# Patient Record
Sex: Female | Born: 1980 | Race: White | Hispanic: No | Marital: Married | Smoking: Former smoker
Health system: Southern US, Community
[De-identification: ages and names within clinical notes are randomized; demographics above are authoritative.]

## PROBLEM LIST (undated history)

## (undated) DIAGNOSIS — T7840XA Allergy, unspecified, initial encounter: Secondary | ICD-10-CM

## (undated) DIAGNOSIS — R112 Nausea with vomiting, unspecified: Secondary | ICD-10-CM

## (undated) DIAGNOSIS — L509 Urticaria, unspecified: Secondary | ICD-10-CM

## (undated) DIAGNOSIS — M2550 Pain in unspecified joint: Secondary | ICD-10-CM

## (undated) DIAGNOSIS — F419 Anxiety disorder, unspecified: Principal | ICD-10-CM

## (undated) DIAGNOSIS — R768 Other specified abnormal immunological findings in serum: Secondary | ICD-10-CM

## (undated) DIAGNOSIS — K219 Gastro-esophageal reflux disease without esophagitis: Secondary | ICD-10-CM

## (undated) DIAGNOSIS — Z9889 Other specified postprocedural states: Secondary | ICD-10-CM

## (undated) DIAGNOSIS — T63331A Toxic effect of venom of brown recluse spider, accidental (unintentional), initial encounter: Secondary | ICD-10-CM

## (undated) HISTORY — DX: Toxic effect of venom of brown recluse spider, accidental (unintentional), initial encounter: T63.331A

## (undated) HISTORY — DX: Allergy, unspecified, initial encounter: T78.40XA

## (undated) HISTORY — PX: LEG SURGERY: SHX1003

## (undated) HISTORY — DX: Other specified abnormal immunological findings in serum: R76.8

## (undated) HISTORY — DX: Anxiety disorder, unspecified: F41.9

## (undated) HISTORY — DX: Pain in unspecified joint: M25.50

## (undated) HISTORY — DX: Urticaria, unspecified: L50.9

## (undated) HISTORY — DX: Gastro-esophageal reflux disease without esophagitis: K21.9

---

## 2011-11-01 LAB — HM HIV SCREENING LAB: HM HIV SCREENING: NEGATIVE

## 2015-07-15 ENCOUNTER — Encounter: Payer: Self-pay | Admitting: Family Medicine

## 2015-11-23 ENCOUNTER — Encounter: Payer: Self-pay | Admitting: Family Medicine

## 2015-12-06 ENCOUNTER — Encounter: Payer: Self-pay | Admitting: Family Medicine

## 2016-04-18 ENCOUNTER — Encounter: Payer: Self-pay | Admitting: Family Medicine

## 2017-03-13 ENCOUNTER — Ambulatory Visit: Payer: Self-pay | Admitting: Family

## 2017-03-13 ENCOUNTER — Encounter: Payer: Self-pay | Admitting: *Deleted

## 2017-03-13 VITALS — BP 112/78 | HR 66 | Temp 98.9°F | Wt 164.4 lb

## 2017-03-13 DIAGNOSIS — J40 Bronchitis, not specified as acute or chronic: Secondary | ICD-10-CM

## 2017-03-13 DIAGNOSIS — J329 Chronic sinusitis, unspecified: Secondary | ICD-10-CM

## 2017-03-13 MED ORDER — AMOXICILLIN-POT CLAVULANATE 875-125 MG PO TABS: 1.0000 | ORAL_TABLET | Freq: Two times a day (BID) | ORAL | 0 refills | Status: DC

## 2017-03-13 MED ORDER — FLUTICASONE PROPIONATE 50 MCG/ACT NA SUSP: 2.0000 | Freq: Every day | NASAL | 6 refills | Status: DC

## 2017-03-13 NOTE — Patient Instructions (Signed)

## 2017-03-13 NOTE — Progress Notes (Signed)
   Subjective:    Patient ID: Shannon Gordon, female    DOB: 1980/07/05, 37 y.o.   MRN: [id number]  Cough  This is a new problem. The current episode started 1 to 4 weeks ago. The problem has been unchanged. The problem occurs every few minutes. The cough is productive of sputum. Associated symptoms include chills, headaches, myalgias, nasal congestion, postnasal drip and rhinorrhea. Pertinent negatives include no ear congestion, ear pain, fever or wheezing. Associated symptoms comments: Sinus pressure . The treatment provided mild relief.      Review of Systems  Constitutional: Positive for chills. Negative for fever.  HENT: Positive for postnasal drip and rhinorrhea. Negative for ear pain.   Respiratory: Positive for cough. Negative for wheezing.   Musculoskeletal: Positive for myalgias.  Neurological: Positive for headaches.  All other systems reviewed and are negative.      Objective:   Physical Exam  Constitutional: She is oriented to person, place, and time. She appears well-developed and well-nourished. No distress.  HENT:  Head: Normocephalic and atraumatic.  Right Ear: External ear normal.  Nose: Mucosal edema and rhinorrhea present. Right sinus exhibits maxillary sinus tenderness. Left sinus exhibits maxillary sinus tenderness.  Mouth/Throat: Posterior oropharyngeal erythema present.  Eyes: Pupils are equal, round, and reactive to light.  Neck: Normal range of motion. Neck supple. No thyromegaly present.  Cardiovascular: Normal rate, regular rhythm, normal heart sounds and intact distal pulses.  No murmur heard. Pulmonary/Chest: Effort normal and breath sounds normal. No respiratory distress. She has no wheezes.  Abdominal: Soft. Bowel sounds are normal. She exhibits no distension. There is no tenderness.  Musculoskeletal: Normal range of motion. She exhibits no edema or tenderness.  Neurological: She is alert and oriented to person, place, and time. She has normal  reflexes. No cranial nerve deficit.  Skin: Skin is warm and dry.  Psychiatric: She has a normal mood and affect. Her behavior is normal. Judgment and thought content normal.  Vitals reviewed.     BP 112/78 (BP Location: Right Arm, Patient Position: Sitting, Cuff Size: Normal)   Pulse 66   Temp 98.9 F (37.2 C) (Oral)   Wt 164 lb 6.4 oz (74.6 kg)   SpO2 98%      Assessment & Plan:  1. Sinobronchitis - Take meds as prescribed - Use a cool mist humidifier  -Force fluids -For any cough or congestion  Use plain Mucinex- regular strength or max strength is fine -For fever or aces or pains- take tylenol or ibuprofen appropriate for age and weight. -Throat lozenges if help -New toothbrush in 3 days - amoxicillin-clavulanate (AUGMENTIN) 875-125 MG tablet; Take 1 tablet by mouth 2 (two) times daily.  Dispense: 14 tablet; Refill: 0 - fluticasone (FLONASE) 50 MCG/ACT nasal spray; Place 2 sprays into both nostrils daily.  Dispense: 16 g; Refill: [place], FNP

## 2017-03-29 ENCOUNTER — Ambulatory Visit: Payer: Self-pay | Admitting: Emergency Medicine

## 2017-03-29 VITALS — BP 118/78 | HR 111 | Temp 99.4°F | Resp 18

## 2017-03-29 DIAGNOSIS — R110 Nausea: Secondary | ICD-10-CM

## 2017-03-29 DIAGNOSIS — J101 Influenza due to other identified influenza virus with other respiratory manifestations: Secondary | ICD-10-CM

## 2017-03-29 DIAGNOSIS — R52 Pain, unspecified: Secondary | ICD-10-CM

## 2017-03-29 LAB — POCT INFLUENZA A/B
INFLUENZA A, POC: POSITIVE — AB
INFLUENZA B, POC: NEGATIVE

## 2017-03-29 MED ORDER — BALOXAVIR MARBOXIL(40 MG DOSE) 2 X 20 MG PO TBPK: 40.0000 mg | ORAL_TABLET | Freq: Once | ORAL | 0 refills | Status: AC

## 2017-03-29 MED ORDER — ONDANSETRON HCL 4 MG PO TABS: 4.0000 mg | ORAL_TABLET | Freq: Three times a day (TID) | ORAL | 0 refills | Status: DC | PRN

## 2017-03-29 NOTE — Patient Instructions (Signed)

## 2017-03-29 NOTE — Progress Notes (Signed)
S: Shannon Gordon is a 37 y.o. female who presents for flu like symptoms to include myalgias, fever, chills, fatigue, malaise. Has taken ibuprofen with minimal relief. Has had nausea as well, has not vomited. Has not eaten. Otherwise in good health.  Review of Systems  Constitutional: Positive for chills, fever and malaise/fatigue.  HENT: Positive for congestion and sore throat.   Respiratory: Positive for cough. Negative for shortness of breath and wheezing.   Cardiovascular: Negative.   Gastrointestinal: Positive for nausea. Negative for diarrhea and vomiting.  Musculoskeletal: Positive for myalgias.  Neurological: Positive for headaches.   O: Results for orders placed or performed in visit on 03/29/17  POCT Influenza A/B  Result Value Ref Range   Influenza A, POC Positive (A) Negative   Influenza B, POC Negative Negative   Vitals:   03/29/17 1031  BP: 118/78  Pulse: (!) 111  Resp: 18  Temp: 99.4 F (37.4 C)  SpO2: 98%   Physical Exam  Constitutional: She appears well-developed and well-nourished. She appears ill. No distress.  HENT:  Head: Normocephalic.  Right Ear: Tympanic membrane and external ear normal.  Left Ear: Tympanic membrane and external ear normal.  Nose: Nose normal.  Mouth/Throat: Uvula is midline and oropharynx is clear and moist.  Eyes: Conjunctivae are normal.  Neck: Neck supple.  Cardiovascular: Regular rhythm. Tachycardia present.  Pulmonary/Chest: Effort normal and breath sounds normal.  Abdominal: Soft. There is no tenderness.  Lymphadenopathy:    She has cervical adenopathy.  Neurological: She is alert.  Skin: Skin is warm. Capillary refill takes less than 2 seconds. She is not diaphoretic.  Nursing note and vitals reviewed.   A: 1. Aches   2. Influenza A   3. Nausea     P:  1. Aches -Tylenol or motrin PRN - POCT Influenza A/B  2. Influenza A -Rest fluids, Mucinex DM, follow up as needed - Baloxavir Marboxil 40 MG Dose (XOFLUZA)  20 (2) MG TBPK; Take 40 mg by mouth once for 1 dose.  Dispense: 2 each; Refill: 0 - ondansetron (ZOFRAN) 4 MG tablet; Take 1 tablet (4 mg total) by mouth every 8 (eight) hours as needed for nausea or vomiting.  Dispense: 30 tablet; Refill: 0  3. Nausea  - Baloxavir Marboxil 40 MG Dose (XOFLUZA) 20 (2) MG TBPK; Take 40 mg by mouth once for 1 dose.  Dispense: 2 each; Refill: 0 - ondansetron (ZOFRAN) 4 MG tablet; Take 1 tablet (4 mg total) by mouth every 8 (eight) hours as needed for nausea or vomiting.  Dispense: 30 tablet; Refill: 0

## 2017-04-23 ENCOUNTER — Other Ambulatory Visit (HOSPITAL_COMMUNITY)
Admission: RE | Admit: 2017-04-23 | Discharge: 2017-04-23 | Disposition: A | Discharge status: Home / Self Care | Payer: 59 | Source: Ambulatory Visit | Attending: Obstetrics and Gynecology | Admitting: Obstetrics and Gynecology

## 2017-04-23 ENCOUNTER — Other Ambulatory Visit: Payer: Self-pay | Admitting: Obstetrics and Gynecology

## 2017-04-23 DIAGNOSIS — Z124 Encounter for screening for malignant neoplasm of cervix: Secondary | ICD-10-CM | POA: Diagnosis not present

## 2017-04-23 DIAGNOSIS — Z01419 Encounter for gynecological examination (general) (routine) without abnormal findings: Secondary | ICD-10-CM | POA: Diagnosis not present

## 2017-04-24 LAB — CYTOLOGY - PAP
ADEQUACY: ABSENT
Diagnosis: NEGATIVE
HPV: NOT DETECTED

## 2017-04-26 ENCOUNTER — Ambulatory Visit: Payer: Self-pay | Admitting: Family Medicine

## 2017-05-02 ENCOUNTER — Encounter: Payer: Self-pay | Admitting: Family Medicine

## 2017-05-02 ENCOUNTER — Ambulatory Visit (INDEPENDENT_AMBULATORY_CARE_PROVIDER_SITE_OTHER): Payer: 59 | Admitting: Family Medicine

## 2017-05-02 VITALS — BP 110/74 | HR 84 | Temp 98.0°F | Ht 64.0 in | Wt 156.8 lb

## 2017-05-02 DIAGNOSIS — M2550 Pain in unspecified joint: Secondary | ICD-10-CM | POA: Diagnosis not present

## 2017-05-02 DIAGNOSIS — F419 Anxiety disorder, unspecified: Secondary | ICD-10-CM | POA: Insufficient documentation

## 2017-05-02 DIAGNOSIS — Z13220 Encounter for screening for lipoid disorders: Secondary | ICD-10-CM

## 2017-05-02 DIAGNOSIS — R768 Other specified abnormal immunological findings in serum: Secondary | ICD-10-CM

## 2017-05-02 DIAGNOSIS — R635 Abnormal weight gain: Secondary | ICD-10-CM | POA: Diagnosis not present

## 2017-05-02 HISTORY — DX: Other specified abnormal immunological findings in serum: R76.8

## 2017-05-02 HISTORY — DX: Anxiety disorder, unspecified: F41.9

## 2017-05-02 HISTORY — DX: Pain in unspecified joint: M25.50

## 2017-05-02 LAB — COMPREHENSIVE METABOLIC PANEL
ALT: 29 U/L (ref 0–35)
AST: 34 U/L (ref 0–37)
Albumin: 4.3 g/dL (ref 3.5–5.2)
Alkaline Phosphatase: 55 U/L (ref 39–117)
BUN: 16 mg/dL (ref 6–23)
CO2: 30 mEq/L (ref 19–32)
Calcium: 10.2 mg/dL (ref 8.4–10.5)
Chloride: 103 mEq/L (ref 96–112)
Creatinine, Ser: 0.63 mg/dL (ref 0.40–1.20)
GFR: 113.1 mL/min (ref >60.00)
Glucose, Bld: 87 mg/dL (ref 70–99)
Potassium: 4.7 mEq/L (ref 3.5–5.1)
Sodium: 139 mEq/L (ref 135–145)
Total Bilirubin: 0.5 mg/dL (ref 0.2–1.2)
Total Protein: 7.3 g/dL (ref 6.0–8.3)

## 2017-05-02 LAB — LIPID PANEL
Cholesterol: 216 mg/dL — ABNORMAL HIGH (ref 0–200)
HDL: 74.7 mg/dL (ref >39.00)
LDL Cholesterol: 133 mg/dL — ABNORMAL HIGH (ref 0–99)
NonHDL: 141.65
Total CHOL/HDL Ratio: 3
Triglycerides: 43 mg/dL (ref 0.0–149.0)
VLDL: 8.6 mg/dL (ref 0.0–40.0)

## 2017-05-02 LAB — CBC WITH DIFFERENTIAL/PLATELET
Basophils Absolute: 0.1 10*3/uL (ref 0.0–0.1)
Basophils Relative: 1.1 % (ref 0.0–3.0)
Eosinophils Absolute: 0.1 10*3/uL (ref 0.0–0.7)
Eosinophils Relative: 2 % (ref 0.0–5.0)
HCT: 41.1 % (ref 36.0–46.0)
Hemoglobin: 14.1 g/dL (ref 12.0–15.0)
Lymphocytes Relative: 38.8 % (ref 12.0–46.0)
Lymphs Abs: 2 10*3/uL (ref 0.7–4.0)
MCHC: 34.2 g/dL (ref 30.0–36.0)
MCV: 91 fl (ref 78.0–100.0)
Monocytes Absolute: 0.3 10*3/uL (ref 0.1–1.0)
Monocytes Relative: 6.5 % (ref 3.0–12.0)
Neutro Abs: 2.7 10*3/uL (ref 1.4–7.7)
Neutrophils Relative %: 51.6 % (ref 43.0–77.0)
Platelets: 255 10*3/uL (ref 150.0–400.0)
RBC: 4.52 Mil/uL (ref 3.87–5.11)
RDW: 13.4 % (ref 11.5–15.5)
WBC: 5.2 10*3/uL (ref 4.0–10.5)

## 2017-05-02 LAB — TSH: TSH: 1.54 u[IU]/mL (ref 0.35–4.50)

## 2017-05-02 MED ORDER — HYDROXYZINE HCL 25 MG PO TABS: 25.0000 mg | ORAL_TABLET | Freq: Every evening | ORAL | 0 refills | Status: DC | PRN

## 2017-05-02 NOTE — Progress Notes (Addendum)
Shannon Gordon is a 37 y.o. female is here for follow up.  History of Present Illness:   HPI: Patient presents for new patient appointment.  She moved from [place].  I actually met her earlier this week with her 52-year-old daughter.  Patient has a history of positive ANA, though she reports that she and her rheumatologist believe that she does not have an autoimmune disease.  She reports hives and anxiety intermittently.  She reports diffuse arthralgias and fatigue at times.  She has had some weight gain but admits that that is due to food indiscretions.  Review of Systems  Constitutional: Negative for chills, diaphoresis, fever, malaise/fatigue and weight loss.  HENT: Negative for hearing loss.   Eyes: Negative for blurred vision.  Respiratory: Negative for cough, shortness of breath and wheezing.   Cardiovascular: Negative for chest pain, palpitations and leg swelling.  Gastrointestinal: Negative for abdominal pain, constipation, diarrhea, heartburn, nausea and vomiting.  Genitourinary: Negative for dysuria, flank pain, frequency, hematuria and urgency.  Musculoskeletal: Positive for joint pain. Negative for myalgias.  Skin: Negative for rash.  Neurological: Negative for dizziness, weakness and headaches.  Psychiatric/Behavioral: Negative for depression, substance abuse and suicidal ideas. The patient is nervous/anxious. The patient does not have insomnia.    Health Maintenance Due  Topic Date Due  . HIV Screening  06/24/1995  . TETANUS/TDAP  06/24/1999   Depression screen PHQ 2/9 05/02/2017  Decreased Interest 0  Down, Depressed, Hopeless 0  PHQ - 2 Score 0     PMHx, SurgHx, SocialHx, FamHx, Medications, and Allergies were reviewed in the Visit Navigator and updated as appropriate.  There are no active problems to display for this patient.  Social History   Tobacco Use  . Smoking status: Former Smoker    Types: Cigarettes    Last attempt to quit: 2013    Years since  quitting: 6.2  Substance Use Topics  . Alcohol use: Not on file  . Drug use: Not on file   Current Medications and Allergies:   .  ondansetron (ZOFRAN) 4 MG tablet, Take 1 tablet (4 mg total) by mouth every 8 (eight) hours as needed for nausea or vomiting., Disp: 30 tablet, Rfl: 0  No Known Allergies   Review of Systems   Pertinent items are noted in the HPI. Otherwise, ROS is negative.  Vitals:   Vitals:   05/02/17 1022  BP: 110/74  Pulse: 84  Temp: 98 F (36.7 C)  TempSrc: Oral  SpO2: 97%  Weight: 156 lb 12.8 oz (71.1 kg)  Height: 5' 4"  (1.626 m)     Body mass index is 26.91 kg/m.   Physical Exam:   Physical Exam  Constitutional: She is oriented to person, place, and time. She appears well-developed and well-nourished. No distress.  HENT:  Head: Normocephalic and atraumatic.  Right Ear: External ear normal.  Left Ear: External ear normal.  Nose: Nose normal.  Mouth/Throat: Oropharynx is clear and moist.  Eyes: Pupils are equal, round, and reactive to light. Conjunctivae and EOM are normal.  Neck: Normal range of motion. Neck supple. No thyromegaly present.  Cardiovascular: Normal rate, regular rhythm, normal heart sounds and intact distal pulses.  Pulmonary/Chest: Effort normal and breath sounds normal.  Abdominal: Soft. Bowel sounds are normal.  Musculoskeletal: Normal range of motion.  Lymphadenopathy:    She has no cervical adenopathy.  Neurological: She is alert and oriented to person, place, and time.  Skin: Skin is warm and dry. Capillary refill takes  less than 2 seconds.  Psychiatric: She has a normal mood and affect. Her behavior is normal.  Nursing note and vitals reviewed.   Results for orders placed or performed in visit on 05/02/17  HM HIV SCREENING LAB  Result Value Ref Range   HM HIV Screening Negative - Validated    Assessment and Plan:   Shontay was seen today for establish care.  Diagnoses and all orders for this  visit:  Anxiety Comments: We reviewed hydroxyzine as an option for her anxiety, especially since it is manifested as itching and hives as well. Orders: -     hydrOXYzine (ATARAX/VISTARIL) 25 MG tablet; Take 1 tablet (25 mg total) by mouth at bedtime as needed for anxiety (insomnia).  Screening for lipid disorders -     Lipid panel  Weight gain Comments: Likely due to increased food intake.  Labs pending. Orders: -     CBC with Differential/Platelet -     Comprehensive metabolic panel -     TSH  Positive ANA (antinuclear antibody) Comments: Patient already follows with rheumatology.  Arthralgia, unspecified joint Comments: Patient has been followed by orthopedics in the past.  She does take Mobic and as needed tramadol.  I am okay with taking his medications over.   . Reviewed expectations re: course of current medical issues. . Discussed self-management of symptoms. . Outlined signs and symptoms indicating need for more acute intervention. . Patient verbalized understanding and all questions were answered. Marland Kitchen Health Maintenance issues including appropriate healthy diet, exercise, and smoking avoidance were discussed with patient. . See orders for this visit as documented in the electronic medical record. . Patient received an After Visit Summary.  Briscoe Deutscher, DO [place] 05/02/2017  Future Appointments  Date Time Provider [place]  10/02/2017  9:00 AM Briscoe Deutscher, DO [facility]

## 2017-07-04 ENCOUNTER — Ambulatory Visit: Payer: Self-pay | Admitting: *Deleted

## 2017-07-04 ENCOUNTER — Encounter: Payer: Self-pay | Admitting: Family Medicine

## 2017-07-04 ENCOUNTER — Ambulatory Visit (INDEPENDENT_AMBULATORY_CARE_PROVIDER_SITE_OTHER): Payer: 59 | Admitting: Family Medicine

## 2017-07-04 VITALS — BP 120/78 | HR 85 | Temp 97.8°F | Ht 64.0 in | Wt 161.0 lb

## 2017-07-04 DIAGNOSIS — S51812A Laceration without foreign body of left forearm, initial encounter: Secondary | ICD-10-CM

## 2017-07-04 DIAGNOSIS — Z23 Encounter for immunization: Secondary | ICD-10-CM | POA: Diagnosis not present

## 2017-07-04 MED ORDER — MUPIROCIN 2 % EX OINT: 1.0000 "application " | TOPICAL_OINTMENT | Freq: Three times a day (TID) | CUTANEOUS | 0 refills | Status: DC

## 2017-07-04 NOTE — Telephone Encounter (Signed)
Pt has a wound approx 2  Inches in length some redness and  Slight puffiness noted.  Same day appointment made with Dr Rogers Blocker

## 2017-07-04 NOTE — Telephone Encounter (Signed)
  Reason for Disposition . [1] Looks infected (spreading redness, pus) AND [2] no fever  Answer Assessment - Initial Assessment Questions 1. APPEARANCE of INJURY: "What does the injury look like?"       Red  Slightly puffy some drainage brownish in color   2. SIZE: "How large is the cut?"       approx 2  Inch  3. BLEEDING: "Is it bleeding now?" If so, ask: "Is it difficult to stop?"        No 4. LOCATION: "Where is the injury located?"         L forearm   5. ONSET: "How long ago did the injury occur?"        3  Days ago  6. MECHANISM: "Tell me how it happened."         sustained a laceration from the edge of a glass table   7. TETANUS: "When was the last tetanus booster?"      2013 or 2014  Not positive which year   8. PREGNANCY: "Is there any chance you are pregnant?" "When was your last menstrual period?"        approx 1  Month ago has  IUD  Protocols used: [place]

## 2017-07-04 NOTE — Progress Notes (Signed)
Patient: Shannon Gordon MRN: [id number] DOB: November 19, 1980 PCP: Briscoe Deutscher, DO     Subjective:  Chief Complaint  Patient presents with  . Wound Check    HPI: The patient is a 37 y.o. female who presents today for deep cut in her left forearm. She was putting a glass top on the table and it cut her. This was on Sunday. She states it bled a lot and is looking better today. The redness is not as bad as it was. No fever/chills. She has used neosporin, h202. Last tetanus was over 5 years ago. She has full range of motion in her arm/hand. Strength and sensation intact. No glass broke off.    Review of Systems  Constitutional: Negative for chills, fatigue and fever.  Respiratory: Negative for chest tightness and shortness of breath.   Cardiovascular: Negative for chest pain, palpitations and leg swelling.  Skin: Positive for wound.  Allergic/Immunologic: Negative for immunocompromised state.  Neurological: Negative for weakness and numbness.  Hematological: Does not bruise/bleed easily.    Allergies Patient has No Known Allergies.  Past Medical History Patient  has a past medical history of Anxiety (05/02/2017), Arthralgia (05/02/2017), and Positive ANA (antinuclear antibody) (05/02/2017).  Surgical History Patient  has no past surgical history on file.  Family History Pateint's family history includes Hypothyroidism in her maternal grandmother; Thyroid disease in her mother.  Social History Patient  reports that she quit smoking about 6 years ago. Her smoking use included cigarettes. She has a 5.00 pack-year smoking history. She has never used smokeless tobacco. She reports that she drinks alcohol. She reports that she does not use drugs.    Objective: Vitals:   07/04/17 1201  BP: 120/78  Pulse: 85  Temp: 97.8 F (36.6 C)  SpO2: 96%  Weight: 161 lb (73 kg)  Height: 5\' 4"  (1.626 m)    Body mass index is 27.64 kg/m.  Physical Exam  Constitutional: She appears  well-developed and well-nourished.  Neurological: No sensory deficit. She exhibits normal muscle tone.  Skin:  4cm laceration. Healing well. No erythema, edema or bleeding.   Vitals reviewed.      Assessment/plan: 1. Laceration of left forearm, initial encounter Tetanus since >5 years. Wound healing nicely. Reassured her. bactroban Bid-TID and discussed keeping covered. If erythema, edema, fever, increasing pain return/ER.  - Td : Tetanus/diphtheria >7yo Preservative  free    Return if symptoms worsen or fail to improve.   Orma Flaming, MD [place]   07/04/2017

## 2017-07-31 DIAGNOSIS — L239 Allergic contact dermatitis, unspecified cause: Secondary | ICD-10-CM | POA: Diagnosis not present

## 2017-08-01 DIAGNOSIS — R21 Rash and other nonspecific skin eruption: Secondary | ICD-10-CM | POA: Diagnosis not present

## 2017-08-02 NOTE — Progress Notes (Signed)
Shannon Gordon is a 37 y.o. female is here for follow up.  History of Present Illness:  Shannon Gordon, [name] acting as scribe for Dr. Briscoe Deutscher.   HPI: Patient states that she started breaking out in rash on both hands on Monday. By Tuesday it had spread to face and scalp. So she was seen by dermatology that day and was given steroid  injection in office. She was not better on Wednesday so she went to urgent care and was given prednisone prescription. It has help a little so far. She would like referral to Allergy for testing.   There are no preventive care reminders to display for this patient. Depression screen [facility] 2/9 08/03/2017 05/02/2017  Decreased Interest 0 0  Down, Depressed, Hopeless 0 0  PHQ - 2 Score 0 0  Altered sleeping 0 -  Tired, decreased energy 0 -  Change in appetite 0 -  Feeling bad or failure about yourself  0 -  Trouble concentrating 0 -  Moving slowly or fidgety/restless 0 -  Suicidal thoughts 0 -  PHQ-9 Score 0 -   PMHx, SurgHx, SocialHx, FamHx, Medications, and Allergies were reviewed in the Visit Navigator and updated as appropriate.   Patient Active Problem List   Diagnosis Date Noted  . Anxiety 05/02/2017  . Weight gain 05/02/2017  . Positive ANA (antinuclear antibody) 05/02/2017  . Arthralgia 05/02/2017   Social History   Tobacco Use  . Smoking status: Former Smoker    Packs/day: 0.50    Years: 10.00    Pack years: 5.00    Types: Cigarettes    Last attempt to quit: 2013    Years since quitting: 6.5  . Smokeless tobacco: Never Used  Substance Use Topics  . Alcohol use: Yes    Comment: occ use only   . Drug use: Never   Current Medications and Allergies:   Current Outpatient Medications:  .  cetirizine (ZYRTEC) 10 MG tablet, , Disp: , Rfl:  .  omeprazole (PRILOSEC) 10 MG capsule, Take 10 mg by mouth daily., Disp: , Rfl:  .  ondansetron (ZOFRAN) 4 MG tablet, Take 1 tablet (4 mg total) by mouth every 8 (eight) hours as needed for nausea  or vomiting., Disp: 30 tablet, Rfl: 0 .  predniSONE (DELTASONE) 50 MG tablet, , Disp: , Rfl:  .  triamcinolone cream (KENALOG) 0.1 %, , Disp: , Rfl:  .  hydrOXYzine (ATARAX/VISTARIL) 25 MG tablet, Take 1 tablet (25 mg total) by mouth every 8 (eight) hours as needed for anxiety or itching (insomnia)., Disp: 60 tablet, Rfl: 5  No Known Allergies Review of Systems   Pertinent items are noted in the HPI. Otherwise, ROS is negative.  Vitals:   Vitals:   08/03/17 1001  BP: 120/78  Pulse: 89  Temp: 98.3 F (36.8 C)  TempSrc: Oral  SpO2: 99%  Weight: 163 lb (73.9 kg)  Height: 5\' 4"  (1.626 m)     Body mass index is 27.98 kg/m.  Physical Exam:   Physical Exam  Constitutional: She appears well-nourished.  HENT:  Head: Normocephalic and atraumatic.  Eyes: Pupils are equal, round, and reactive to light. EOM are normal.  Neck: Normal range of motion. Neck supple.  Cardiovascular: Normal rate, regular rhythm, normal heart sounds and intact distal pulses.  Pulmonary/Chest: Effort normal.  Abdominal: Soft.  Skin: Skin is warm.  Psychiatric: She has a normal mood and affect. Her behavior is normal.  Nursing note and vitals reviewed.  Results for  orders placed or performed in visit on 05/02/17  HM HIV SCREENING LAB  Result Value Ref Range   HM HIV Screening Negative - Validated   CBC with Differential/Platelet  Result Value Ref Range   WBC 5.2 4.0 - 10.5 K/uL   RBC 4.52 3.87 - 5.11 Mil/uL   Hemoglobin 14.1 12.0 - 15.0 g/dL   HCT 41.1 36.0 - 46.0 %   MCV 91.0 78.0 - 100.0 fl   MCHC 34.2 30.0 - 36.0 g/dL   RDW 13.4 11.5 - 15.5 %   Platelets 255.0 150.0 - 400.0 K/uL   Neutrophils Relative % 51.6 43.0 - 77.0 %   Lymphocytes Relative 38.8 12.0 - 46.0 %   Monocytes Relative 6.5 3.0 - 12.0 %   Eosinophils Relative 2.0 0.0 - 5.0 %   Basophils Relative 1.1 0.0 - 3.0 %   Neutro Abs 2.7 1.4 - 7.7 K/uL   Lymphs Abs 2.0 0.7 - 4.0 K/uL   Monocytes Absolute 0.3 0.1 - 1.0 K/uL    Eosinophils Absolute 0.1 0.0 - 0.7 K/uL   Basophils Absolute 0.1 0.0 - 0.1 K/uL  Comprehensive metabolic panel  Result Value Ref Range   Sodium 139 135 - 145 mEq/L   Potassium 4.7 3.5 - 5.1 mEq/L   Chloride 103 96 - 112 mEq/L   CO2 30 19 - 32 mEq/L   Glucose, Bld 87 70 - 99 mg/dL   BUN 16 6 - 23 mg/dL   Creatinine, Ser 0.63 0.40 - 1.20 mg/dL   Total Bilirubin 0.5 0.2 - 1.2 mg/dL   Alkaline Phosphatase 55 39 - 117 U/L   AST 34 0 - 37 U/L   ALT 29 0 - 35 U/L   Total Protein 7.3 6.0 - 8.3 g/dL   Albumin 4.3 3.5 - 5.2 g/dL   Calcium 10.2 8.4 - 10.5 mg/dL   GFR 113.10 >60.00 mL/min  TSH  Result Value Ref Range   TSH 1.54 0.35 - 4.50 uIU/mL  Lipid panel  Result Value Ref Range   Cholesterol 216 (H) 0 - 200 mg/dL   Triglycerides 43.0 0.0 - 149.0 mg/dL   HDL 74.70 >39.00 mg/dL   VLDL 8.6 0.0 - 40.0 mg/dL   LDL Cholesterol 133 (H) 0 - 99 mg/dL   Total CHOL/HDL Ratio 3    NonHDL 141.65     Assessment and Plan:   Shannon Gordon was seen today for rash.  Diagnoses and all orders for this visit:  Rash and nonspecific skin eruption -     Ambulatory referral to Allergy -     hydrOXYzine (ATARAX/VISTARIL) 25 MG tablet; Take 1 tablet (25 mg total) by mouth every 8 (eight) hours as needed for anxiety or itching (insomnia).  Positive ANA (antinuclear antibody)    . Reviewed expectations re: course of current medical issues. . Discussed self-management of symptoms. . Outlined signs and symptoms indicating need for more acute intervention. . Patient verbalized understanding and all questions were answered. Marland Kitchen Health Maintenance issues including appropriate healthy diet, exercise, and smoking avoidance were discussed with patient. . See orders for this visit as documented in the electronic medical record. . Patient received an After Visit Summary.  Briscoe Deutscher, DO [place] 08/03/2017  Future Appointments  Date Time Provider [place]  10/02/2017  9:00 AM Briscoe Deutscher, DO [facility]   [name] served as scribe during this visit. History, Physical, and Plan performed by medical provider. The above documentation has been reviewed and is accurate and  complete. Briscoe Deutscher, D.O.

## 2017-08-03 ENCOUNTER — Encounter: Payer: Self-pay | Admitting: Family Medicine

## 2017-08-03 ENCOUNTER — Ambulatory Visit (INDEPENDENT_AMBULATORY_CARE_PROVIDER_SITE_OTHER): Payer: 59 | Admitting: Family Medicine

## 2017-08-03 VITALS — BP 120/78 | HR 89 | Temp 98.3°F | Ht 64.0 in | Wt 163.0 lb

## 2017-08-03 DIAGNOSIS — R768 Other specified abnormal immunological findings in serum: Secondary | ICD-10-CM

## 2017-08-03 DIAGNOSIS — R21 Rash and other nonspecific skin eruption: Secondary | ICD-10-CM | POA: Diagnosis not present

## 2017-08-03 MED ORDER — HYDROXYZINE HCL 25 MG PO TABS: 25.0000 mg | ORAL_TABLET | Freq: Three times a day (TID) | ORAL | 5 refills | Status: DC | PRN

## 2017-08-31 ENCOUNTER — Encounter: Payer: Self-pay | Admitting: Family Medicine

## 2017-09-01 ENCOUNTER — Other Ambulatory Visit: Payer: Self-pay | Admitting: Family Medicine

## 2017-09-01 ENCOUNTER — Telehealth: Payer: 59 | Admitting: Nurse Practitioner

## 2017-09-01 DIAGNOSIS — L242 Irritant contact dermatitis due to solvents: Secondary | ICD-10-CM

## 2017-09-01 DIAGNOSIS — F419 Anxiety disorder, unspecified: Secondary | ICD-10-CM

## 2017-09-01 MED ORDER — PREDNISONE 10 MG (21) PO TBPK: ORAL_TABLET | ORAL | 0 refills | Status: DC

## 2017-09-01 NOTE — Progress Notes (Signed)
E Visit for Rash  We are sorry that you are not feeling well. Here is how we plan to help!       Prednisone 10 mg daily for 6 days (see taper instructions below)  Directions for 6 day taper: Day 1: 2 tablets before breakfast, 1 after both lunch & dinner and 2 at bedtime Day 2: 1 tab before breakfast, 1 after both lunch & dinner and 2 at bedtime Day 3: 1 tab at each meal & 1 at bedtime Day 4: 1 tab at breakfast, 1 at lunch, 1 at bedtime Day 5: 1 tab at breakfast & 1 tab at bedtime Day 6: 1 tab at breakfast      HOME CARE:   Take cool showers and avoid direct sunlight.  Apply cool compress or wet dressings.  Take a bath in an oatmeal bath.  Sprinkle content of one Aveeno packet under running faucet with comfortably warm water.  Bathe for 15-20 minutes, 1-2 times daily.  Pat dry with a towel. Do not rub the rash.  Use hydrocortisone cream.  Take an antihistamine like Benadryl for widespread rashes that itch.  The adult dose of Benadryl is 25-50 mg by mouth 4 times daily.  Caution:  This type of medication may cause sleepiness.  Do not drink alcohol, drive, or operate dangerous machinery while taking antihistamines.  Do not take these medications if you have prostate enlargement.  Read package instructions thoroughly on all medications that you take.  GET HELP RIGHT AWAY IF:   Symptoms don't go away after treatment.  Severe itching that persists.  If you rash spreads or swells.  If you rash begins to smell.  If it blisters and opens or develops a yellow-brown crust.  You develop a fever.  You have a sore throat.  You become short of breath.  MAKE SURE YOU:  Understand these instructions. Will watch your condition. Will get help right away if you are not doing well or get worse.  Thank you for choosing an e-visit. Your e-visit answers were reviewed by a board certified advanced clinical practitioner to complete your personal care plan. Depending upon the  condition, your plan could have included both over the counter or prescription medications. Please review your pharmacy choice. Be sure that the pharmacy you have chosen is open so that you can pick up your prescription now.  If there is a problem you may message your provider in [place] to have the prescription routed to another pharmacy. Your safety is important to [place]. If you have drug allergies check your prescription carefully.  For the next 24 hours, you can use MyChart to ask questions about today's visit, request a non-urgent call back, or ask for a work or school excuse from your e-visit provider. You will get an email in the next two days asking about your experience. I hope that your e-visit has been valuable and will speed your recovery.

## 2017-09-11 ENCOUNTER — Ambulatory Visit: Payer: Self-pay | Admitting: Allergy and Immunology

## 2017-09-27 ENCOUNTER — Ambulatory Visit (INDEPENDENT_AMBULATORY_CARE_PROVIDER_SITE_OTHER): Payer: 59 | Admitting: Allergy

## 2017-09-27 ENCOUNTER — Encounter: Payer: Self-pay | Admitting: Allergy

## 2017-09-27 VITALS — BP 118/68 | HR 74 | Temp 98.4°F | Resp 18 | Ht 64.0 in | Wt 168.0 lb

## 2017-09-27 DIAGNOSIS — L508 Other urticaria: Secondary | ICD-10-CM | POA: Diagnosis not present

## 2017-09-27 DIAGNOSIS — L253 Unspecified contact dermatitis due to other chemical products: Secondary | ICD-10-CM | POA: Diagnosis not present

## 2017-09-27 DIAGNOSIS — H1010 Acute atopic conjunctivitis, unspecified eye: Secondary | ICD-10-CM

## 2017-09-27 DIAGNOSIS — J309 Allergic rhinitis, unspecified: Secondary | ICD-10-CM | POA: Diagnosis not present

## 2017-09-27 DIAGNOSIS — T781XXA Other adverse food reactions, not elsewhere classified, initial encounter: Secondary | ICD-10-CM | POA: Diagnosis not present

## 2017-09-27 MED ORDER — CETIRIZINE HCL 10 MG PO TABS
10.0000 mg | ORAL_TABLET | Freq: Two times a day (BID) | ORAL | 5 refills | Status: AC
Start: 2017-09-27 — End: ?

## 2017-09-27 NOTE — Patient Instructions (Addendum)
Chronic Hives   - at this time etiology of hives and swelling is unknown.  Hives can be caused by a variety of different triggers including illness/infection, foods, medications, stings, exercise, pressure, vibrations, extremes of temperature to name a few however majority of the time there is no identifiable trigger.  Your symptoms have been ongoing for years making this chronic thus will obtain labwork to evaluate:  tryptase, hive panel, thyroid antibodies, environmental panel, alpha-gal panel, stinging insect panel, ANA  - for management of hives if they return start zyrtec 10mg  1 table twice a day and zantac 150mg  1 tablet twice a day  - let us know if you start having any fevers with hives, any joint aches/pains, if hives last longer than 24-48 hours or leave any marks/bruising or any swelling.    Possible contact dermatitis   - concern for furniture stain as culprit to rash this summer   - discussed patch testing and recommend bringing in the furniture stain so this can be tested for as well   - patch testing is best placed on Monday with return on Wednesday and Friday of same week for readings.  Once patches are placed do not get wet.    True Test looks for the following sensitivities:     Rhinoconjunctivitis, presumed allergic  - as above will obtain environmental allergy panel  - zyrtec as above  - for nasal congestion/drainage recommend OTC nasal steroid spray like Flonase, Rhinocort, Nasacort 2 sprays each nostril daily. Use for 1-2 weeks at a time before stopping once symptoms improve  - for itchy/watery eyes can use OTC Aloway or Zaditor 1 drop each eye daily as needed  Adverse food reaction  - continue avoidance of orange/citrus.  Will obtain serum IgE levels to citrus.    Follow-up for patch testing

## 2017-09-27 NOTE — Progress Notes (Signed)
New Patient Note  RE: Shannon Gordon MRN: [id number] DOB: 10/10/1980 Date of Office Visit: 09/27/2017  Referring provider: Briscoe Deutscher, DO Primary care provider: Briscoe Deutscher, DO  Chief Complaint: hives  History of present illness: Shannon Gordon is a 37 y.o. female presenting today for consultation for hives.   She states she has had outbreaks of hives.  This summer has had 2 episodes of outbreaks.  She has been having hives yearly since 2004.  The first time she had hives she was in college.  At that time she thought the hives were stress-induced. Hives normally last about 3-4 weeks at a time.  She has had eye swelling with the hives before.  She states she always has joint pain (it is not worse with hives).  Hives do not leave behind any marks/bruising.  She also denies any fevers with her hives.  She does recall one episode of hives where she was stung by nst of yellow jackets.  She otherwise does not recall having any preceding illnesses prior to the onset of hives episodes and she also denies any triggering foods or medications or lotions/detergents or body products.  She states she normally would see her PCP for the hives and would be prescribed steroids either oral or IM.   She had a recent outbreak of hives this summer and she feels she must have reacted to a furniture stain as the rash developed on the second occasion of using the stain.  She states the rash was in the area where her skin did come with the stain.  The rash started about 24 hours after exposure to the stain.     She states in addition to steroids she has also tried use of benadryl and zyrtec 1 tab a day.   She states she has had a positive ANA in the past and she did see a rheumatologist and reports her ANA normalized.  She has had negative testing for lupus antibody, Sjogren's antibody, complement, muscle enzyme, rheumatoid arthritis screen, SCL 70 antibody and CCP antibody.  She no longer follows with  rheumatology at this time.  She does report nasal congestion/drainage, sneezing, watery/itchy eyes.  As above she has used Zyrtec to help with the symptoms.  She also has used saline spray.    No history of ashtma.   She does report eczema in ears.  When she was a child she was told she was allergic oranges and told she developed a rash.   Citrus will cause an upset stomach.    Moved from [place] to [place] around Feb 2019.    Review of systems: Review of Systems  Constitutional: Negative for chills, fever and malaise/fatigue.  HENT: Negative for congestion, ear discharge, nosebleeds and sore throat.   Eyes: Negative for pain, discharge and redness.  Respiratory: Negative for cough, shortness of breath and wheezing.   Cardiovascular: Negative for chest pain.  Gastrointestinal: Negative for abdominal pain, constipation, diarrhea, nausea and vomiting.  Musculoskeletal: Positive for joint pain.  Skin: Positive for itching and rash.  Neurological: Negative for headaches.    All other systems negative unless noted above in HPI  Past medical history: Past Medical History:  Diagnosis Date  . Anxiety 05/02/2017   We reviewed hydroxyzine as an option for her anxiety, especially since it is manifested as itching and hives as well.  . Arthralgia 05/02/2017   Patient has been followed by orthopedics in the past.  She does take Mobic and as needed tramadol.  I am okay with taking his medications over.  Owens Shark recluse spider bite   . Positive ANA (antinuclear antibody) 05/02/2017   Patient already follows with rheumatology.  . Urticaria     Past surgical history: History reviewed. No pertinent surgical history.  Family history:  Family History  Problem Relation Age of Onset  . Thyroid disease Mother   . Hypothyroidism Maternal Grandmother     Social history: Lives in a home with out carpeting with gas heating and central cooling.  2 dogs in the home.  There is concern for water damage or mildew in  the home.  No concerns for roaches in the home.  She works in Aeronautical engineer.  She denies a smoking history.  Medication List: Allergies as of 09/27/2017   No Known Allergies     Medication List        Accurate as of 09/27/17  6:44 PM. Always use your most recent med list.          cetirizine 10 MG tablet Commonly known as:  ZYRTEC   hydrOXYzine 25 MG tablet Commonly known as:  ATARAX/VISTARIL Take 1 tablet (25 mg total) by mouth every 8 (eight) hours as needed for anxiety or itching (insomnia).   omeprazole 10 MG capsule Commonly known as:  PRILOSEC Take 10 mg by mouth daily.   ondansetron 4 MG tablet Commonly known as:  ZOFRAN Take 1 tablet (4 mg total) by mouth every 8 (eight) hours as needed for nausea or vomiting.   triamcinolone cream 0.1 % Commonly known as:  KENALOG       Known medication allergies: No Known Allergies   Physical examination: Blood pressure 118/68, pulse 74, temperature 98.4 F (36.9 C), temperature source Oral, resp. rate 18, height 5\' 4"  (1.626 m), weight 168 lb (76.2 kg), SpO2 99 %.  General: Alert, interactive, in no acute distress. HEENT: PERRLA, TMs pearly gray, turbinates minimally edematous without discharge, post-pharynx non erythematous. Neck: Supple without lymphadenopathy. Lungs: Clear to auscultation without wheezing, rhonchi or rales. {no increased work of breathing. CV: Normal S1, S2 without murmurs. Abdomen: Nondistended, nontender. Skin: Warm and dry, without lesions or rashes. Extremities:  No clubbing, cyanosis or edema. Neuro:   Grossly intact.  Diagnositics/Labs: Labs: Reviewed the following labs Component     Latest Ref Rng & Units 05/02/2017  WBC     4.0 - 10.5 K/uL 5.2  RBC     3.87 - 5.11 Mil/uL 4.52  Hemoglobin     12.0 - 15.0 g/dL 14.1  HCT     36.0 - 46.0 % 41.1  MCV     78.0 - 100.0 fl 91.0  MCHC     30.0 - 36.0 g/dL 34.2  RDW     11.5 - 15.5 % 13.4  Platelets     150.0 - 400.0 K/uL 255.0    Neutrophils     43.0 - 77.0 % 51.6  Lymphocytes     12.0 - 46.0 % 38.8  Monocytes Relative     3.0 - 12.0 % 6.5  Eosinophil     0.0 - 5.0 % 2.0  Basophil     0.0 - 3.0 % 1.1  NEUT#     1.4 - 7.7 K/uL 2.7  Lymphocyte #     0.7 - 4.0 K/uL 2.0  Monocyte #     0.1 - 1.0 K/uL 0.3  Eosinophils Absolute     0.0 - 0.7 K/uL 0.1  Basophils Absolute  0.0 - 0.1 K/uL 0.1   Component     Latest Ref Rng & Units 05/02/2017  Sodium     135 - 145 mEq/L 139  Potassium     3.5 - 5.1 mEq/L 4.7  Chloride     96 - 112 mEq/L 103  CO2     19 - 32 mEq/L 30  Glucose     70 - 99 mg/dL 87  BUN     6 - 23 mg/dL 16  Creatinine     0.40 - 1.20 mg/dL 0.63  Total Bilirubin     0.2 - 1.2 mg/dL 0.5  Alkaline Phosphatase     39 - 117 U/L 55  AST     0 - 37 U/L 34  ALT     0 - 35 U/L 29  Total Protein     6.0 - 8.3 g/dL 7.3  Albumin     3.5 - 5.2 g/dL 4.3  Calcium     8.4 - 10.5 mg/dL 10.2  GFR     >60.00 mL/min 113.10   Component     Latest Ref Rng & Units 05/02/2017  TSH     0.35 - 4.50 uIU/mL 1.54    Assessment and plan: Chronic urticaria  - at this time etiology of hives and swelling is unknown.  Hives can be caused by a variety of different triggers including illness/infection, foods, medications, stings, exercise, pressure, vibrations, extremes of temperature to name a few however majority of the time there is no identifiable trigger.  Your symptoms have been ongoing for years making this chronic thus will obtain labwork to evaluate:  tryptase, hive panel, thyroid antibodies, environmental panel, alpha-gal panel, stinging insect panel, ANA  - for management of hives if they return start zyrtec 10mg  1 tablet twice a day and zantac 150mg  1 tablet twice a day  - let us know if you start having any fevers with hives, any joint aches/pains, if hives last longer than 24-48 hours or leave any marks/bruising or any swelling.    Possible contact dermatitis   - concern for furniture stain  as culprit to rash this summer   - discussed patch testing and recommend bringing in the furniture stain so this can be tested for as well   - patch testing is best placed on Monday with return on Wednesday and Friday of same week for readings.  Once patches are placed do not get wet.    Rhinoconjunctivitis, presumed allergic  - as above will obtain environmental allergy panel  - zyrtec as above  - for nasal congestion/drainage recommend OTC nasal steroid spray like Flonase, Rhinocort, Nasacort 2 sprays each nostril daily. Use for 1-2 weeks at a time before stopping once symptoms improve  - for itchy/watery eyes can use OTC Aloway or Zaditor 1 drop each eye daily as needed  Adverse food reaction  - continue avoidance of orange/citrus.  Will obtain serum IgE levels to citrus.    Follow-up for patch testing  I appreciate the opportunity to take part in [place] care. Please do not hesitate to contact me with questions.  Sincerely,   Prudy Feeler, MD Allergy/Immunology [facility] [place] [facility] [place]

## 2017-10-02 ENCOUNTER — Ambulatory Visit: Payer: 59 | Admitting: Family Medicine

## 2017-10-08 ENCOUNTER — Ambulatory Visit (INDEPENDENT_AMBULATORY_CARE_PROVIDER_SITE_OTHER): Payer: 59 | Admitting: Allergy & Immunology

## 2017-10-08 ENCOUNTER — Encounter: Payer: Self-pay | Admitting: Allergy & Immunology

## 2017-10-08 DIAGNOSIS — L253 Unspecified contact dermatitis due to other chemical products: Secondary | ICD-10-CM | POA: Diagnosis not present

## 2017-10-08 NOTE — Progress Notes (Signed)
Shannon Gordon is a 37 y.o. female is here for follow up.  History of Present Illness:   Shannon Gordon, Shannon Gordon acting as scribe for Dr. Briscoe Deutscher.   HPI: Patient in office for follow up. She has been seen by allergist yesterday and had patch testing. She has follow up tomorrow for that. Still with urticaria. Needs hydroxyzine sent in. No new symptoms.   There are no preventive care reminders to display for this patient. Depression screen [facility] 2/9 10/09/2017 08/03/2017 05/02/2017  Decreased Interest 0 0 0  Down, Depressed, Hopeless 0 0 0  PHQ - 2 Score 0 0 0  Altered sleeping 0 0 -  Tired, decreased energy 0 0 -  Change in appetite 0 0 -  Feeling bad or failure about yourself  0 0 -  Trouble concentrating 0 0 -  Moving slowly or fidgety/restless 0 0 -  Suicidal thoughts 0 0 -  PHQ-9 Score 0 0 -  Difficult doing work/chores Not difficult at all - -   PMHx, SurgHx, SocialHx, FamHx, Medications, and Allergies were reviewed in the Visit Navigator and updated as appropriate.   Patient Active Problem List   Diagnosis Date Noted  . Anxiety 05/02/2017  . Weight gain 05/02/2017  . Positive ANA (antinuclear antibody) 05/02/2017  . Arthralgia 05/02/2017   Social History   Tobacco Use  . Smoking status: Former Smoker    Packs/day: 0.50    Years: 10.00    Pack years: 5.00    Types: Cigarettes    Last attempt to quit: 2013    Years since quitting: 6.6  . Smokeless tobacco: Never Used  Substance Use Topics  . Alcohol use: Yes    Comment: occ use only   . Drug use: Never   Current Medications and Allergies:   Current Outpatient Medications:  .  cetirizine (ZYRTEC) 10 MG tablet, Take 1 tablet (10 mg total) by mouth 2 (two) times daily., Disp: 30 tablet, Rfl: 5 .  hydrOXYzine (ATARAX/VISTARIL) 25 MG tablet, Take 1 tablet (25 mg total) by mouth every 8 (eight) hours as needed for anxiety or itching (insomnia)., Disp: 60 tablet, Rfl: 5 .  omeprazole (PRILOSEC) 10 MG capsule, Take  10 mg by mouth daily., Disp: , Rfl:  .  ondansetron (ZOFRAN) 4 MG tablet, Take 1 tablet (4 mg total) by mouth every 8 (eight) hours as needed for nausea or vomiting., Disp: 30 tablet, Rfl: 0 .  triamcinolone cream (KENALOG) 0.1 %, , Disp: , Rfl:   No Known Allergies Review of Systems   Pertinent items are noted in the HPI. Otherwise, ROS is negative.  Vitals:   Vitals:   10/09/17 0907  BP: 110/64  Pulse: 72  Temp: 98.1 F (36.7 C)  TempSrc: Oral  SpO2: 98%  Weight: 167 lb 6.4 oz (75.9 kg)  Height: 5\' 4"  (1.626 m)     Body mass index is 28.73 kg/m.  Physical Exam:   Physical Exam  Constitutional: She appears well-nourished.  HENT:  Head: Normocephalic and atraumatic.  Eyes: Pupils are equal, round, and reactive to light. EOM are normal.  Neck: Normal range of motion. Neck supple.  Cardiovascular: Normal rate, regular rhythm, normal heart sounds and intact distal pulses.  Pulmonary/Chest: Effort normal.  Abdominal: Soft.  Skin: Skin is warm.  Psychiatric: She has a normal mood and affect. Her behavior is normal.  Nursing note and vitals reviewed.  Assessment and Plan:   Shannon Gordon was seen today for follow-up.  Diagnoses and  all orders for this visit:  Rash and nonspecific skin eruption -     hydrOXYzine (ATARAX/VISTARIL) 25 MG tablet; Take 1 tablet (25 mg total) by mouth every 8 (eight) hours as needed for anxiety or itching (insomnia).   . Reviewed expectations re: course of current medical issues. . Discussed self-management of symptoms. . Outlined signs and symptoms indicating need for more acute intervention. . Patient verbalized understanding and all questions were answered. Marland Kitchen Health Maintenance issues including appropriate healthy diet, exercise, and smoking avoidance were discussed with patient. . See orders for this visit as documented in the electronic medical record. . Patient received an After Visit Summary.  Shannon Gordon served as Education administrator during this visit.  History, Physical, and Plan performed by medical provider. The above documentation has been reviewed and is accurate and complete. Briscoe Deutscher, [place], [place], [place] [facility] 10/09/2017

## 2017-10-08 NOTE — Progress Notes (Signed)
    Follow-up Note  RE: Shannon Gordon MRN: [id number] DOB: Jun 17, 1980 Date of Office Visit: 10/08/2017  Primary care provider: Briscoe Deutscher, DO Referring provider: Briscoe Deutscher, DO   Shannon Gordon returns to the office today for the patch test placement, given suspected history of contact dermatitis. She tells me that she breaks out in hives 1-2 times per year. But she does not like going on steroids all of the time.    Diagnostics: True Test patches placed.    Plan:   Allergic contact dermatitis - Instructions provided on care of the patches for the next 48 hours. Shannon Gordon was instructed to avoid showering for the next 48 hours. Shannon Gordon will follow up in 48 hours and 96 hours for patch readings.    Salvatore Marvel, MD  [facility] [place] [facility] [place]

## 2017-10-09 ENCOUNTER — Ambulatory Visit (INDEPENDENT_AMBULATORY_CARE_PROVIDER_SITE_OTHER): Payer: 59 | Admitting: Family Medicine

## 2017-10-09 DIAGNOSIS — R21 Rash and other nonspecific skin eruption: Secondary | ICD-10-CM

## 2017-10-09 LAB — ALLERGEN PROFILE, FOOD-CITRUS
Allergen Grapefruit IgE: <0.1 kU/L
Orange: <0.1 kU/L

## 2017-10-09 LAB — TRYPTASE: Tryptase: 5.7 ug/L (ref 2.2–13.2)

## 2017-10-09 LAB — ALLERGENS W/TOTAL IGE AREA 2
Alternaria Alternata IgE: <0.1 kU/L
COCKROACH, GERMAN IGE: 0.15 kU/L — AB
Cladosporium Herbarum IgE: <0.1 kU/L
Common Silver Birch IgE: <0.1 kU/L
IGE (IMMUNOGLOBULIN E), SERUM: 10 [IU]/mL (ref 6–495)
Johnson Grass IgE: <0.1 kU/L
Maple/Box Elder IgE: <0.1 kU/L
Mouse Urine IgE: <0.1 kU/L
Oak, White IgE: <0.1 kU/L
Pecan, Hickory IgE: <0.1 kU/L
Penicillium Chrysogen IgE: <0.1 kU/L
Pigweed, Rough IgE: <0.1 kU/L
Sheep Sorrel IgE Qn: <0.1 kU/L
Timothy Grass IgE: <0.1 kU/L
White Mulberry IgE: <0.1 kU/L

## 2017-10-09 LAB — ALPHA-GAL PANEL
Alpha Gal IgE*: <0.1 kU/L (ref <0.10)
BEEF CLASS INTERPRETATION: 0
Beef (Bos spp) IgE: <0.1 kU/L (ref <0.35)
Class Interpretation: 0
PORK CLASS INTERPRETATION: 0

## 2017-10-09 LAB — ALLERGEN STINGING INSECT PANEL
Honeybee IgE: <0.1 kU/L
Hornet, White Face, IgE: <0.1 kU/L
Paper Wasp IgE: <0.1 kU/L
Yellow Jacket, IgE: <0.1 kU/L

## 2017-10-09 LAB — THYROID ANTIBODIES: THYROID PEROXIDASE ANTIBODY: 14 [IU]/mL (ref 0–34)

## 2017-10-09 LAB — ANA W/REFLEX: ANA: NEGATIVE

## 2017-10-09 LAB — CHRONIC URTICARIA: cu index: 9.6 (ref <10)

## 2017-10-09 MED ORDER — HYDROXYZINE HCL 25 MG PO TABS: 25.0000 mg | ORAL_TABLET | Freq: Three times a day (TID) | ORAL | 5 refills | Status: DC | PRN

## 2017-10-10 ENCOUNTER — Ambulatory Visit: Payer: 59 | Admitting: Allergy

## 2017-10-10 DIAGNOSIS — L253 Unspecified contact dermatitis due to other chemical products: Secondary | ICD-10-CM

## 2017-10-10 NOTE — Progress Notes (Signed)
    Follow-up Note  RE: Shannon Gordon MRN: [id number] DOB: 1981/01/27 Date of Office Visit: 10/10/2017  Primary care provider: Briscoe Deutscher, DO Referring provider: Briscoe Deutscher, DO   Erynn returns to the office today for the initial patch test interpretation, given suspected history of contact dermatitis.    Diagnostics:  TRUE TEST 48 hour reading: Weak positive reaction to #30 budesonide  Plan:  Allergic contact dermatitis  - She will return in 2 days for final reading  - will provide with informational handout on positive substances/chemicals on Friday  Prudy Feeler, MD [facility] [place] [facility] [place]

## 2017-10-12 ENCOUNTER — Ambulatory Visit: Payer: 59 | Admitting: Allergy

## 2017-10-12 DIAGNOSIS — L253 Unspecified contact dermatitis due to other chemical products: Secondary | ICD-10-CM

## 2017-10-12 NOTE — Progress Notes (Signed)
    Follow-up Note  RE: Ellana Kawa MRN: [id number] DOB: 11-09-1980 Date of Office Visit: 10/12/2017  Primary care provider: Briscoe Deutscher, DO Referring provider: Briscoe Deutscher, DO   Karee returns to the office today for the final patch test interpretation, given suspected history of contact dermatitis.    Diagnostics:  TRUE TEST 96 hour reading:  Weak positive to #13 p-tert butylphenol formaldehyde resin; strong positive to #28 gold sodium thiosulfate.    TRUE TEST 48 hour reading on 10/10/17: Weak positive reaction to #30 budesonide  Plan:  Allergic contact dermatitis  The patient has been provided detailed information regarding the substances she is sensitive to, as well as products containing the substances.  Meticulous avoidance of these substances is recommended. If avoidance is not possible, the use of barrier creams or lotions is recommended.  Prudy Feeler, MD [facility] [place]

## 2017-11-12 NOTE — Progress Notes (Signed)
   Shannon Gordon is a 37 y.o. female here for an acute visit.  History of Present Illness:   Sinusitis  This is a new problem. The current episode started in the past 7 days. The problem has been gradually worsening since onset. There has been no fever. The pain is moderate. Associated symptoms include congestion and sinus pressure. Pertinent negatives include no chills, coughing or shortness of breath.   PMHx, SurgHx, SocialHx, Medications, and Allergies were reviewed in the Visit Navigator and updated as appropriate.  Current Medications:   .  cetirizine (ZYRTEC) 10 MG tablet, Take 1 tablet (10 mg total) by mouth 2 (two) times daily., Disp: 30 tablet, Rfl: 5 .  hydrOXYzine (ATARAX/VISTARIL) 25 MG tablet, Take 1 tablet (25 mg total) by mouth every 8 (eight) hours as needed for anxiety or itching (insomnia)., Disp: 60 tablet, Rfl: 5 .  omeprazole (PRILOSEC) 10 MG capsule, Take 10 mg by mouth daily., Disp: , Rfl:  .  ondansetron (ZOFRAN) 4 MG tablet, Take 1 tablet (4 mg total) by mouth every 8 (eight) hours as needed for nausea or vomiting., Disp: 30 tablet, Rfl: 0 .  triamcinolone cream (KENALOG) 0.1 %, , Disp: , Rfl:    No Known Allergies   Review of Systems:   Pertinent items are noted in the HPI. Otherwise, ROS is negative.  Vitals:   Vitals:   11/13/17 0745  BP: 102/66  Pulse: 68  Temp: 98.2 F (36.8 C)  TempSrc: Oral  SpO2: 97%  Weight: 167 lb 6.4 oz (75.9 kg)  Height: 5\' 4"  (1.626 m)     Body mass index is 28.73 kg/m.  Physical Exam:   Physical Exam  Constitutional: She appears well-nourished.  HENT:  Head: Normocephalic and atraumatic.  Nose: Left sinus exhibits maxillary sinus tenderness.  Eyes: Pupils are equal, round, and reactive to light. EOM are normal.  Neck: Normal range of motion. Neck supple.  Cardiovascular: Normal rate, regular rhythm, normal heart sounds and intact distal pulses.  Pulmonary/Chest: Effort normal.  Abdominal: Soft.  Skin: Skin  is warm.  Psychiatric: She has a normal mood and affect. Her behavior is normal.  Nursing note and vitals reviewed.  Assessment and Plan:   [name] was seen today for sinusitis.  Diagnoses and all orders for this visit:  Subacute maxillary sinusitis -     amoxicillin (AMOXIL) 875 MG tablet; Take 1 tablet (875 mg total) by mouth 2 (two) times daily. -     predniSONE (DELTASONE) 5 MG tablet; Take 6,5,4,3,2,1 and done -     fluconazole (DIFLUCAN) 150 MG tablet; Take one then three days later take another.   . Reviewed expectations re: course of current medical issues. . Discussed self-management of symptoms. . Outlined signs and symptoms indicating need for more acute intervention. . Patient verbalized understanding and all questions were answered. Marland Kitchen Health Maintenance issues including appropriate healthy diet, exercise, and smoking avoidance were discussed with patient. . See orders for this visit as documented in the electronic medical record. . Patient received an After Visit Summary.  Briscoe Deutscher, DO [place], [place] [facility] 11/13/2017

## 2017-11-13 ENCOUNTER — Ambulatory Visit (INDEPENDENT_AMBULATORY_CARE_PROVIDER_SITE_OTHER): Payer: 59 | Admitting: Family Medicine

## 2017-11-13 ENCOUNTER — Encounter: Payer: Self-pay | Admitting: Family Medicine

## 2017-11-13 VITALS — BP 102/66 | HR 68 | Temp 98.2°F | Ht 64.0 in | Wt 167.4 lb

## 2017-11-13 DIAGNOSIS — J0100 Acute maxillary sinusitis, unspecified: Secondary | ICD-10-CM

## 2017-11-13 MED ORDER — PREDNISONE 5 MG PO TABS: ORAL_TABLET | ORAL | 0 refills | Status: DC

## 2017-11-13 MED ORDER — AMOXICILLIN 875 MG PO TABS: 875.0000 mg | ORAL_TABLET | Freq: Two times a day (BID) | ORAL | 0 refills | Status: DC

## 2017-11-13 MED ORDER — FLUCONAZOLE 150 MG PO TABS: ORAL_TABLET | ORAL | 0 refills | Status: DC

## 2017-11-20 DIAGNOSIS — H16223 Keratoconjunctivitis sicca, not specified as Sjogren's, bilateral: Secondary | ICD-10-CM | POA: Diagnosis not present

## 2017-12-14 ENCOUNTER — Ambulatory Visit: Payer: Self-pay | Admitting: Family Medicine

## 2017-12-14 VITALS — BP 110/68 | HR 117 | Temp 99.1°F | Resp 20 | Wt 167.8 lb

## 2017-12-14 DIAGNOSIS — R509 Fever, unspecified: Secondary | ICD-10-CM

## 2017-12-14 DIAGNOSIS — J069 Acute upper respiratory infection, unspecified: Secondary | ICD-10-CM

## 2017-12-14 LAB — POCT INFLUENZA A/B
INFLUENZA A, POC: NEGATIVE
INFLUENZA B, POC: NEGATIVE

## 2017-12-14 NOTE — Patient Instructions (Signed)
Upper Respiratory Infection, Adult Most upper respiratory infections (URIs) are caused by a virus. A URI affects the nose, throat, and upper air passages. The most common type of URI is often called "the common cold." Follow these instructions at home:  Take medicines only as told by your doctor.  Gargle warm saltwater or take cough drops to comfort your throat as told by your doctor.  Use a warm mist humidifier or inhale steam from a shower to increase air moisture. This may make it easier to breathe.  Drink enough fluid to keep your pee (urine) clear or pale yellow.  Eat soups and other clear broths.  Have a healthy diet.  Rest as needed.  Go back to work when your fever is gone or your doctor says it is okay. ? You may need to stay home longer to avoid giving your URI to others. ? You can also wear a face mask and wash your hands often to prevent spread of the virus.  Use your inhaler more if you have asthma.  Do not use any tobacco products, including cigarettes, chewing tobacco, or electronic cigarettes. If you need help quitting, ask your doctor. Contact a doctor if:  You are getting worse, not better.  Your symptoms are not helped by medicine.  You have chills.  You are getting more short of breath.  You have brown or red mucus.  You have yellow or brown discharge from your nose.  You have pain in your face, especially when you bend forward.  You have a fever.  You have puffy (swollen) neck glands.  You have pain while swallowing.  You have white areas in the back of your throat. Get help right away if:  You have very bad or constant: ? Headache. ? Ear pain. ? Pain in your forehead, behind your eyes, and over your cheekbones (sinus pain). ? Chest pain.  You have long-lasting (chronic) lung disease and any of the following: ? Wheezing. ? Long-lasting cough. ? Coughing up blood. ? A change in your usual mucus.  You have a stiff neck.  You have  changes in your: ? Vision. ? Hearing. ? Thinking. ? Mood. This information is not intended to replace advice given to you by your health care provider. Make sure you discuss any questions you have with your health care provider. Document Released: 07/05/2007 Document Revised: 09/19/2015 Document Reviewed: 04/23/2013 Elsevier Interactive Patient Education  2018 [employer]  

## 2017-12-14 NOTE — Progress Notes (Signed)
  Shannon Gordon is a 37 y.o. female who presents today with concerns of abrupt onset, fever, chills, body aches and malaise. Today she present with mild temp reports home temp TMAX- 101 that was successfully treated with advil. She reports her daughter has a runny nose but other than that she denies any known sick contacts.  Review of Systems  Constitutional: Positive for chills, fever and malaise/fatigue.  HENT: Positive for sinus pain and sore throat. Negative for congestion, ear discharge and ear pain.   Eyes: Negative.   Respiratory: Positive for cough. Negative for sputum production and shortness of breath.   Cardiovascular: Negative.  Negative for chest pain.  Gastrointestinal: Negative for abdominal pain, diarrhea, nausea and vomiting.  Genitourinary: Negative for dysuria, frequency, hematuria and urgency.  Musculoskeletal: Negative for myalgias.  Skin: Negative.   Neurological: Negative for headaches.  Endo/Heme/Allergies: Negative.   Psychiatric/Behavioral: Negative.     O: Vitals:   12/14/17 1906  BP: 110/68  Pulse: (!) 117  Resp: 20  Temp: 99.1 F (37.3 C)  SpO2: 97%     Physical Exam  Constitutional: She is oriented to person, place, and time. She appears well-developed and well-nourished. She is active.  Non-toxic appearance. She does not have a sickly appearance. She does not appear ill.  HENT:  Head: Normocephalic.  Right Ear: Hearing, tympanic membrane, external ear and ear canal normal.  Left Ear: Hearing, tympanic membrane, external ear and ear canal normal.  Nose: Rhinorrhea present. Right sinus exhibits no maxillary sinus tenderness and no frontal sinus tenderness. Left sinus exhibits no maxillary sinus tenderness and no frontal sinus tenderness.  Mouth/Throat: Uvula is midline and oropharynx is clear and moist.  Neck: Normal range of motion. Neck supple.  Cardiovascular: Normal rate, regular rhythm, normal heart sounds and normal pulses.   Pulmonary/Chest: Effort normal and breath sounds normal.  Abdominal: Soft. Bowel sounds are normal.  Musculoskeletal: Normal range of motion.  Lymphadenopathy:       Head (right side): No submental and no submandibular adenopathy present.       Head (left side): No submental and no submandibular adenopathy present.    She has no cervical adenopathy.  Neurological: She is alert and oriented to person, place, and time.  Psychiatric: She has a normal mood and affect.  Vitals reviewed.  Patient appears unwell but no evidence of congestion, cough, or other bacterial indications.  A: 1. Fever, unspecified fever cause   2. Upper respiratory tract infection, unspecified type    P: Discussed exam findings, diagnosis etiology and medication use and indications reviewed with patient. Follow- Up and discharge instructions provided. No emergent/urgent issues found on exam.  Patient verbalized understanding of information provided and agrees with plan of care (POC), all questions answered.  1. Fever, unspecified fever cause - POCT Influenza A/B Results for orders placed or performed in visit on 12/14/17 (from the past 24 hour(s))  POCT Influenza A/B     Status: Normal   Collection Time: 12/14/17  7:14 PM  Result Value Ref Range   Influenza A, POC Negative Negative   Influenza B, POC Negative Negative    2. Upper respiratory tract infection, unspecified type Negative Flu- discussed with patient likely viral and will monitor for condition progression. Advised to continue over the counter supportive care for symptoms management wit balanced diet and adequate hydration. Will consider abx if symptoms not improved.

## 2017-12-16 ENCOUNTER — Encounter: Payer: Self-pay | Admitting: Family Medicine

## 2017-12-18 ENCOUNTER — Ambulatory Visit: Payer: 59 | Admitting: Family Medicine

## 2017-12-18 NOTE — Progress Notes (Deleted)
   Shannon Gordon is a 37 y.o. female here for an acute visit.  History of Present Illness:   Shannon Gordon, [name] acting as scribe for Dr. Briscoe Deutscher.   HPI: Patient seen on 11/13/17 and given Amoxicillin, Prednisone and Diflucan for subacute maxillary sinusitis.     Current Medications:   Current Outpatient Medications:  .  amoxicillin (AMOXIL) 875 MG tablet, Take 1 tablet (875 mg total) by mouth 2 (two) times daily. (Patient not taking: Reported on 12/14/2017), Disp: 20 tablet, Rfl: 0 .  cetirizine (ZYRTEC) 10 MG tablet, Take 1 tablet (10 mg total) by mouth 2 (two) times daily. (Patient not taking: Reported on 12/14/2017), Disp: 30 tablet, Rfl: 5 .  fluconazole (DIFLUCAN) 150 MG tablet, Take one then three days later take another. (Patient not taking: Reported on 12/14/2017), Disp: 2 tablet, Rfl: 0 .  hydrOXYzine (ATARAX/VISTARIL) 25 MG tablet, Take 1 tablet (25 mg total) by mouth every 8 (eight) hours as needed for anxiety or itching (insomnia)., Disp: 60 tablet, Rfl: 5 .  omeprazole (PRILOSEC) 10 MG capsule, Take 10 mg by mouth daily., Disp: , Rfl:  .  ondansetron (ZOFRAN) 4 MG tablet, Take 1 tablet (4 mg total) by mouth every 8 (eight) hours as needed for nausea or vomiting. (Patient not taking: Reported on 12/14/2017), Disp: 30 tablet, Rfl: 0 .  predniSONE (DELTASONE) 5 MG tablet, Take 6,5,4,3,2,1 and done (Patient not taking: Reported on 12/14/2017), Disp: 21 tablet, Rfl: 0 .  triamcinolone cream (KENALOG) 0.1 %, , Disp: , Rfl:    No Known Allergies Review of Systems:   Pertinent items are noted in the HPI. Otherwise, ROS is negative.  Vitals:  There were no vitals filed for this visit.   There is no height or weight on file to calculate BMI.  Physical Exam:   Physical Exam  Results for orders placed or performed in visit on 12/14/17  POCT Influenza A/B  Result Value Ref Range   Influenza A, POC Negative Negative   Influenza B, POC Negative Negative     Assessment and Plan:   There are no diagnoses linked to this encounter.  . Reviewed expectations re: course of current medical issues. . Discussed self-management of symptoms. . Outlined signs and symptoms indicating need for more acute intervention. . Patient verbalized understanding and all questions were answered. Marland Kitchen Health Maintenance issues including appropriate healthy diet, exercise, and smoking avoidance were discussed with patient. . See orders for this visit as documented in the electronic medical record. . Patient received an After Visit Summary.  *** [name] served as Education administrator during this visit. History, Physical, and Plan performed by medical provider. The above documentation has been reviewed and is accurate and complete. Briscoe Deutscher, D.O.  Briscoe Deutscher, DO [place] 12/18/2017

## 2017-12-25 ENCOUNTER — Ambulatory Visit (INDEPENDENT_AMBULATORY_CARE_PROVIDER_SITE_OTHER): Payer: 59 | Admitting: Physician Assistant

## 2017-12-25 ENCOUNTER — Encounter: Payer: Self-pay | Admitting: Physician Assistant

## 2017-12-25 VITALS — BP 118/78 | HR 75 | Temp 97.7°F | Ht 64.0 in | Wt 169.4 lb

## 2017-12-25 DIAGNOSIS — J0100 Acute maxillary sinusitis, unspecified: Secondary | ICD-10-CM

## 2017-12-25 MED ORDER — FLUCONAZOLE 150 MG PO TABS: 150.0000 mg | ORAL_TABLET | Freq: Once | ORAL | 0 refills | Status: AC

## 2017-12-25 MED ORDER — PREDNISONE 5 MG PO TABS: ORAL_TABLET | ORAL | 0 refills | Status: DC

## 2017-12-25 MED ORDER — AMOXICILLIN-POT CLAVULANATE 875-125 MG PO TABS: 1.0000 | ORAL_TABLET | Freq: Two times a day (BID) | ORAL | 0 refills | Status: DC

## 2017-12-25 NOTE — Progress Notes (Signed)
Shannon Gordon is a 37 y.o. female here for a new problem.  History of Present Illness:   Chief Complaint  Patient presents with  . Sinus Problem    x 10 days     URI   This is a new problem. The current episode started 1 to 4 weeks ago. The problem has been gradually worsening. The maximum temperature recorded prior to her arrival was 100.4 - 100.9 F. Associated symptoms include congestion, ear pain, a plugged ear sensation, sinus pain, a sore throat and swollen glands. Pertinent negatives include no abdominal pain or chest pain. She has tried acetaminophen, sleep and increased fluids for the symptoms. The treatment provided no relief.     Past Medical History:  Diagnosis Date  . Anxiety 05/02/2017   We reviewed hydroxyzine as an option for her anxiety, especially since it is manifested as itching and hives as well.  . Arthralgia 05/02/2017   Patient has been followed by orthopedics in the past.  She does take Mobic and as needed tramadol.  I am okay with taking his medications over.  [place] Shark recluse spider bite   . Positive ANA (antinuclear antibody) 05/02/2017   Patient already follows with rheumatology.  . Urticaria      Social History   Socioeconomic History  . Marital status: Married    Spouse name: Not on file  . Number of children: Not on file  . Years of education: 21  . Highest education level: Bachelor's degree (e.g., BA, AB, BS)  Occupational History  . Not on file  Social Needs  . Financial resource strain: Not on file  . Food insecurity:    Worry: Not on file    Inability: Not on file  . Transportation needs:    Medical: Not on file    Non-medical: Not on file  Tobacco Use  . Smoking status: Former Smoker    Packs/day: 0.50    Years: 10.00    Pack years: 5.00    Types: Cigarettes    Last attempt to quit: 2013    Years since quitting: 6.9  . Smokeless tobacco: Never Used  Substance and Sexual Activity  . Alcohol use: Yes    Comment: occ use only   .  Drug use: Never  . Sexual activity: Not on file  Lifestyle  . Physical activity:    Days per week: Not on file    Minutes per session: Not on file  . Stress: Not on file  Relationships  . Social connections:    Talks on phone: Not on file    Gets together: Not on file    Attends religious service: Not on file    Active member of club or organization: Not on file    Attends meetings of clubs or organizations: Not on file    Relationship status: Not on file  . Intimate partner violence:    Fear of current or ex partner: Not on file    Emotionally abused: Not on file    Physically abused: Not on file    Forced sexual activity: Not on file  Other Topics Concern  . Not on file  Social History Narrative  . Not on file    History reviewed. No pertinent surgical history.  Family History  Problem Relation Age of Onset  . Thyroid disease Mother   . Hypercalcemia Mother   . Hypothyroidism Maternal Grandmother     No Known Allergies  Current Medications:   Current Outpatient Medications:  .  cetirizine (ZYRTEC) 10 MG tablet, Take 1 tablet (10 mg total) by mouth 2 (two) times daily., Disp: 30 tablet, Rfl: 5 .  hydrOXYzine (ATARAX/VISTARIL) 25 MG tablet, Take 1 tablet (25 mg total) by mouth every 8 (eight) hours as needed for anxiety or itching (insomnia)., Disp: 60 tablet, Rfl: 5 .  omeprazole (PRILOSEC) 10 MG capsule, Take 10 mg by mouth daily., Disp: , Rfl:  .  ondansetron (ZOFRAN) 4 MG tablet, Take 1 tablet (4 mg total) by mouth every 8 (eight) hours as needed for nausea or vomiting., Disp: 30 tablet, Rfl: 0 .  triamcinolone cream (KENALOG) 0.1 %, , Disp: , Rfl:  .  amoxicillin-clavulanate (AUGMENTIN) 875-125 MG tablet, Take 1 tablet by mouth 2 (two) times daily., Disp: 20 tablet, Rfl: 0 .  fluconazole (DIFLUCAN) 150 MG tablet, Take 1 tablet (150 mg total) by mouth once for 1 dose., Disp: 1 tablet, Rfl: 0 .  predniSONE (DELTASONE) 5 MG tablet, 6-5-4-3-2-1-off, Disp: 21 tablet,  Rfl: 0   Review of Systems:   Review of Systems  HENT: Positive for congestion, ear pain, sinus pain and sore throat.   Cardiovascular: Negative for chest pain.  Gastrointestinal: Negative for abdominal pain.    Vitals:   Vitals:   12/25/17 1301  BP: 118/78  Pulse: 75  Temp: 97.7 F (36.5 C)  TempSrc: Oral  SpO2: 99%  Weight: 169 lb 6.1 oz (76.8 kg)  Height: 5\' 4"  (1.626 m)     Body mass index is 29.07 kg/m.  Physical Exam:   Physical Exam  Constitutional: She appears well-developed. She is cooperative.  Non-toxic appearance. She does not have a sickly appearance. She does not appear ill. No distress.  HENT:  Head: Normocephalic and atraumatic.  Right Ear: Tympanic membrane, external ear and ear canal normal. Tympanic membrane is not erythematous, not retracted and not bulging.  Left Ear: Tympanic membrane, external ear and ear canal normal. Tympanic membrane is not erythematous, not retracted and not bulging.  Nose: Right sinus exhibits maxillary sinus tenderness. Right sinus exhibits no frontal sinus tenderness. Left sinus exhibits maxillary sinus tenderness. Left sinus exhibits no frontal sinus tenderness.  Mouth/Throat: Uvula is midline. No posterior oropharyngeal edema or posterior oropharyngeal erythema.  Eyes: Conjunctivae and lids are normal.  Neck: Trachea normal.  Cardiovascular: Normal rate, regular rhythm, S1 normal, S2 normal and normal heart sounds.  Pulmonary/Chest: Effort normal and breath sounds normal. She has no decreased breath sounds. She has no wheezes. She has no rhonchi. She has no rales.  Lymphadenopathy:    She has no cervical adenopathy.  Neurological: She is alert.  Skin: Skin is warm, dry and intact.  Psychiatric: She has a normal mood and affect. Her speech is normal and behavior is normal.  Nursing note and vitals reviewed.   Assessment and Plan:   Shannon Gordon was seen today for sinus problem.  Diagnoses and all orders for this  visit:  Subacute maxillary sinusitis No red flags on exam.  Will initiate prednisone and Augmentin per orders. Discussed taking medications as prescribed. Reviewed return precautions including worsening fever, SOB, worsening cough or other concerns. Push fluids and rest. I recommend that patient follow-up if symptoms worsen or persist despite treatment x 7-10 days, sooner if needed.  Other orders -     predniSONE (DELTASONE) 5 MG tablet; 6-5-4-3-2-1-off -     amoxicillin-clavulanate (AUGMENTIN) 875-125 MG tablet; Take 1 tablet by mouth 2 (two) times daily. -     fluconazole (DIFLUCAN) 150 MG tablet;  Take 1 tablet (150 mg total) by mouth once for 1 dose.   . Reviewed expectations re: course of current medical issues. . Discussed self-management of symptoms. . Outlined signs and symptoms indicating need for more acute intervention. . Patient verbalized understanding and all questions were answered. . See orders for this visit as documented in the electronic medical record. . Patient received an After-Visit Summary.   Inda Coke, PA-C

## 2017-12-25 NOTE — Patient Instructions (Addendum)
It was great to see you!  Please start the prednisone and antibiotic.  The diflucan has been sent in if you need it!  Push fluids and get plenty of rest. Please return if you are not improving as expected, or if you have high fevers (>101.5) or difficulty swallowing or worsening productive cough.  Call clinic with questions.  I hope you start feeling better soon!

## 2018-02-05 ENCOUNTER — Ambulatory Visit (INDEPENDENT_AMBULATORY_CARE_PROVIDER_SITE_OTHER): Payer: 59

## 2018-02-05 ENCOUNTER — Encounter: Payer: Self-pay | Admitting: Family Medicine

## 2018-02-05 DIAGNOSIS — Z23 Encounter for immunization: Secondary | ICD-10-CM | POA: Diagnosis not present

## 2018-02-11 DIAGNOSIS — Z30433 Encounter for removal and reinsertion of intrauterine contraceptive device: Secondary | ICD-10-CM | POA: Diagnosis not present

## 2018-03-25 DIAGNOSIS — Z30431 Encounter for routine checking of intrauterine contraceptive device: Secondary | ICD-10-CM | POA: Diagnosis not present

## 2018-03-25 DIAGNOSIS — N760 Acute vaginitis: Secondary | ICD-10-CM | POA: Diagnosis not present

## 2018-04-09 ENCOUNTER — Ambulatory Visit: Payer: 59 | Admitting: Family Medicine

## 2018-04-15 ENCOUNTER — Ambulatory Visit: Payer: 59 | Admitting: Family Medicine

## 2018-04-18 ENCOUNTER — Encounter (INDEPENDENT_AMBULATORY_CARE_PROVIDER_SITE_OTHER): Payer: 59 | Admitting: Family Medicine

## 2018-04-18 DIAGNOSIS — J019 Acute sinusitis, unspecified: Secondary | ICD-10-CM

## 2018-04-18 MED ORDER — PREDNISONE 5 MG PO TABS: ORAL_TABLET | ORAL | 0 refills | Status: DC

## 2018-04-18 MED ORDER — AMOXICILLIN-POT CLAVULANATE 875-125 MG PO TABS: 1.0000 | ORAL_TABLET | Freq: Two times a day (BID) | ORAL | 0 refills | Status: DC

## 2018-04-18 NOTE — Telephone Encounter (Signed)
@  [facility] VISIT   Patient agreed to [facility] visit and is aware that copayment and coinsurance may apply.Patient was treated using telemedicine according to accepted telemedicine protocols.  Subjective:  HPI: Patient c/o sinus pain and pressure, unilateral, right. See myChart documentation. "I have been taking mucinex which helps a little and zyrtec.No fever.I had a little congestion in my chest but it seems to be gone and just the pain in my right sinuses.Yes the last time the Augmentin and Prednisone helped.I use [facility] on [address].I believe it is the same one as last time.Thank you so much!"  Patient Active Problem List   Diagnosis Date Noted  . Anxiety 05/02/2017  . Weight gain 05/02/2017  . Positive ANA (antinuclear antibody) 05/02/2017  . Arthralgia 05/02/2017   Social History   Tobacco Use  . Smoking status: Former Smoker    Packs/day: 0.50    Years: 10.00    Pack years: 5.00    Types: Cigarettes    Last attempt to quit: 2013    Years since quitting: 7.2  . Smokeless tobacco: Never Used  Substance Use Topics  . Alcohol use: Yes    Comment: occ use only    Current Outpatient Medications:  .  cetirizine (ZYRTEC) 10 MG tablet, Take 1 tablet (10 mg total) by mouth 2 (two) times daily., Disp: 30 tablet, Rfl: 5 .  hydrOXYzine (ATARAX/VISTARIL) 25 MG tablet, Take 1 tablet (25 mg total) by mouth every 8 (eight) hours as needed for anxiety or itching (insomnia)., Disp: 60 tablet, Rfl: 5 .  omeprazole (PRILOSEC) 10 MG capsule, Take 10 mg by mouth daily., Disp: , Rfl:  .  ondansetron (ZOFRAN) 4 MG tablet, Take 1 tablet (4 mg total) by mouth every 8 (eight) hours as needed for nausea or vomiting., Disp: 30 tablet, Rfl: 00 .  triamcinolone cream (KENALOG) 0.1 %, , Disp: , Rfl:   No Known Allergies  Assessment & Plan:  Bacterial Sinusitis - Orders as below.   Meds ordered this encounter  Medications  . amoxicillin-clavulanate (AUGMENTIN) 875-125  MG tablet    Sig: Take 1 tablet by mouth 2 (two) times daily.    Dispense:  20 tablet    Refill:  0  . predniSONE (DELTASONE) 5 MG tablet    Sig: 6-5-4-3-2-1-off    Dispense:  21 tablet    Refill:  0    Briscoe Deutscher, DO 04/18/2018  A total of 7 minutes were spent by me to personally review the patient-generated inquiry, review patient records and data pertinent to assessment of the patient's problem, develop a management plan including generation of prescriptions and/or orders, and on subsequent communication with the patient through secure the MyChart portal service.   There is no separately reported E/M service related to this service in the past 7 days nor does the patient have an upcoming soonest available appointment for this issue. This work was completed in less than 7 days.   The patient consented to this service today (see patient agreement prior to ongoing communication). Patient counseled regarding the need for in-person exam for certain conditions and was advised to call the office if any changing or worsening symptoms occur.   The codes to be used for the E/M service are: 5404743602 for 5-10 minutes of time spent on the inquiry. 99422 for 11-20 minutes. 99423 for 21+ minutes.

## 2018-06-19 ENCOUNTER — Encounter: Payer: Self-pay | Admitting: Family Medicine

## 2018-06-19 ENCOUNTER — Other Ambulatory Visit: Payer: Self-pay

## 2018-06-19 ENCOUNTER — Ambulatory Visit (INDEPENDENT_AMBULATORY_CARE_PROVIDER_SITE_OTHER): Payer: 59 | Admitting: Family Medicine

## 2018-06-19 VITALS — BP 108/60 | HR 80 | Temp 98.5°F | Ht 64.0 in | Wt 175.0 lb

## 2018-06-19 DIAGNOSIS — R768 Other specified abnormal immunological findings in serum: Secondary | ICD-10-CM | POA: Diagnosis not present

## 2018-06-19 DIAGNOSIS — R358 Other polyuria: Secondary | ICD-10-CM

## 2018-06-19 DIAGNOSIS — Z13220 Encounter for screening for lipoid disorders: Secondary | ICD-10-CM

## 2018-06-19 DIAGNOSIS — F419 Anxiety disorder, unspecified: Secondary | ICD-10-CM

## 2018-06-19 DIAGNOSIS — R3589 Other polyuria: Secondary | ICD-10-CM

## 2018-06-19 DIAGNOSIS — M5442 Lumbago with sciatica, left side: Secondary | ICD-10-CM | POA: Diagnosis not present

## 2018-06-19 DIAGNOSIS — R635 Abnormal weight gain: Secondary | ICD-10-CM | POA: Diagnosis not present

## 2018-06-19 DIAGNOSIS — R450 Nervousness: Secondary | ICD-10-CM

## 2018-06-19 DIAGNOSIS — M5441 Lumbago with sciatica, right side: Secondary | ICD-10-CM

## 2018-06-19 NOTE — Progress Notes (Signed)
Virtual Visit via Video   Due to the COVID-19 pandemic, this visit was completed with telemedicine (audio/video) technology to reduce patient and provider exposure as well as to preserve personal protective equipment.   I connected with Jose Persia by a video enabled telemedicine application and verified that I am speaking with the correct person using two identifiers. Location patient: Home Location provider: [facility], Office Persons participating in the virtual visit: [address] Circle, Briscoe Deutscher, DO [name], [name] acting as scribe for Dr. Briscoe Deutscher.   I discussed the limitations of evaluation and management by telemedicine and the availability of in person appointments. The patient expressed understanding and agreed to proceed.  Care Team   Patient Care Team: Briscoe Deutscher, DO as PCP - General (Family Medicine)  Subjective:   HPI: Patient would like to have thyroid checked. She has family history of thyroid issues. She has had increased weight gain recently and is not able to lose it. She has been having "jittery" feeling that has been increased recently.   She did have recent IUD placed a few months ago.   Back pain: She has has had history of this in the past and has done PT with improvement. She has a large dog that had surgery and has required her to lift more to care for him. She does have history of positive ANA. Her pain is in the low back in to right more than left leg. Pain goes from the back and outside of thighs into back of calf. She has tried some over the counter pain medications and stretches with no improvement. She has increased pain with bending as well as when she is in one position for more than 20 minutes. She would like to try PT in the next few weeks.   Review of Systems  Constitutional: Negative for chills and fever.  HENT: Negative for hearing loss and tinnitus.   Eyes: Negative for blurred vision and double vision.  Respiratory:  Negative for cough and hemoptysis.   Cardiovascular: Negative for chest pain and palpitations.  Gastrointestinal: Negative for nausea and vomiting.  Genitourinary: Negative for dysuria, frequency and urgency.  Musculoskeletal: Negative for myalgias.  Skin: Negative for rash.  Neurological: Negative for dizziness and headaches.  Endo/Heme/Allergies: Does not bruise/bleed easily.  Psychiatric/Behavioral: Negative for depression.     Patient Active Problem List   Diagnosis Date Noted  . Anxiety 05/02/2017  . Weight gain 05/02/2017  . Positive ANA (antinuclear antibody) 05/02/2017  . Arthralgia 05/02/2017    Social History   Tobacco Use  . Smoking status: Former Smoker    Packs/day: 0.50    Years: 10.00    Pack years: 5.00    Types: Cigarettes    Last attempt to quit: 2013    Years since quitting: 7.3  . Smokeless tobacco: Never Used  Substance Use Topics  . Alcohol use: Yes    Comment: occ use only     Current Outpatient Medications:  .  cetirizine (ZYRTEC) 10 MG tablet, Take 1 tablet (10 mg total) by mouth 2 (two) times daily., Disp: 30 tablet, Rfl: 5 .  hydrOXYzine (ATARAX/VISTARIL) 25 MG tablet, Take 1 tablet (25 mg total) by mouth every 8 (eight) hours as needed for anxiety or itching (insomnia)., Disp: 60 tablet, Rfl: 5 .  omeprazole (PRILOSEC) 10 MG capsule, Take 10 mg by mouth daily., Disp: , Rfl:  .  ondansetron (ZOFRAN) 4 MG tablet, Take 1 tablet (4 mg total) by mouth every 8 (  eight) hours as needed for nausea or vomiting., Disp: 30 tablet, Rfl: 0 .  triamcinolone cream (KENALOG) 0.1 %, , Disp: , Rfl:   No Known Allergies  Objective:   VITALS: Per patient if applicable, see vitals. GENERAL: Alert, appears well and in no acute distress. HEENT: Atraumatic, conjunctiva clear, no obvious abnormalities on inspection of external nose and ears. NECK: Normal movements of the head and neck. CARDIOPULMONARY: No increased WOB. Speaking in clear sentences. I:E ratio WNL.    MS: Moves all visible extremities without noticeable abnormality. PSYCH: Pleasant and cooperative, well-groomed. Speech normal rate and rhythm. Affect is appropriate. Insight and judgement are appropriate. Attention is focused, linear, and appropriate.  NEURO: CN grossly intact. Oriented as arrived to appointment on time with no prompting. Moves both UE equally.  SKIN: No obvious lesions, wounds, erythema, or cyanosis noted on face or hands.  Depression screen [facility] 2/9 10/09/2017 08/03/2017 05/02/2017  Decreased Interest 0 0 0  Down, Depressed, Hopeless 0 0 0  PHQ - 2 Score 0 0 0  Altered sleeping 0 0 -  Tired, decreased energy 0 0 -  Change in appetite 0 0 -  Feeling bad or failure about yourself  0 0 -  Trouble concentrating 0 0 -  Moving slowly or fidgety/restless 0 0 -  Suicidal thoughts 0 0 -  PHQ-9 Score 0 0 -  Difficult doing work/chores Not difficult at all - -    Assessment and Plan:   Shannon Gordon was seen today for follow-up.  Diagnoses and all orders for this visit:  Acute bilateral low back pain with bilateral sciatica -     Ambulatory referral to Physical Therapy; Future  Anxiety  Positive ANA (antinuclear antibody)  Weight gain -     Hemoglobin A1c; Future -     TSH; Future  Jittery feeling -     CBC with Differential/Platelet; Future -     Comprehensive metabolic panel; Future -     TSH; Future  Polyuria -     CBC with Differential/Platelet; Future -     Comprehensive metabolic panel; Future -     Hemoglobin A1c; Future  Screening for lipid disorders -     Lipid panel; Future    . COVID-19 Education: The signs and symptoms of COVID-19 were discussed with the patient and how to seek care for testing if needed. The importance of social distancing was discussed today. . Reviewed expectations re: course of current medical issues. . Discussed self-management of symptoms. . Outlined signs and symptoms indicating need for more acute intervention. . Patient  verbalized understanding and all questions were answered. Marland Kitchen Health Maintenance issues including appropriate healthy diet, exercise, and smoking avoidance were discussed with patient. . See orders for this visit as documented in the electronic medical record.  Briscoe Deutscher, DO  Records requested if needed. Time spent: 25 minutes, of which >50% was spent in obtaining information about her symptoms, reviewing her previous labs, evaluations, and treatments, counseling her about her condition (please see the discussed topics above), and developing a plan to further investigate it; she had a number of questions which I addressed.

## 2018-06-20 ENCOUNTER — Other Ambulatory Visit (INDEPENDENT_AMBULATORY_CARE_PROVIDER_SITE_OTHER): Payer: 59

## 2018-06-20 ENCOUNTER — Encounter: Payer: Self-pay | Admitting: Family Medicine

## 2018-06-20 DIAGNOSIS — R635 Abnormal weight gain: Secondary | ICD-10-CM

## 2018-06-20 DIAGNOSIS — R450 Nervousness: Secondary | ICD-10-CM | POA: Diagnosis not present

## 2018-06-20 DIAGNOSIS — R3589 Other polyuria: Secondary | ICD-10-CM

## 2018-06-20 DIAGNOSIS — Z13220 Encounter for screening for lipoid disorders: Secondary | ICD-10-CM

## 2018-06-20 DIAGNOSIS — R358 Other polyuria: Secondary | ICD-10-CM

## 2018-06-20 LAB — CBC WITH DIFFERENTIAL/PLATELET
Basophils Absolute: 0 10*3/uL (ref 0.0–0.1)
Basophils Relative: 0.7 % (ref 0.0–3.0)
Eosinophils Absolute: 0.1 10*3/uL (ref 0.0–0.7)
Eosinophils Relative: 1.5 % (ref 0.0–5.0)
HCT: 39.9 % (ref 36.0–46.0)
Hemoglobin: 14 g/dL (ref 12.0–15.0)
Lymphocytes Relative: 40.5 % (ref 12.0–46.0)
Lymphs Abs: 2.6 10*3/uL (ref 0.7–4.0)
MCHC: 35 g/dL (ref 30.0–36.0)
MCV: 91.8 fl (ref 78.0–100.0)
Monocytes Absolute: 0.4 10*3/uL (ref 0.1–1.0)
Monocytes Relative: 6.7 % (ref 3.0–12.0)
Neutro Abs: 3.2 10*3/uL (ref 1.4–7.7)
Neutrophils Relative %: 50.6 % (ref 43.0–77.0)
Platelets: 235 10*3/uL (ref 150.0–400.0)
RBC: 4.35 Mil/uL (ref 3.87–5.11)
RDW: 13.4 % (ref 11.5–15.5)
WBC: 6.3 10*3/uL (ref 4.0–10.5)

## 2018-06-20 LAB — COMPREHENSIVE METABOLIC PANEL
ALT: 14 U/L (ref 0–35)
AST: 18 U/L (ref 0–37)
Albumin: 4.2 g/dL (ref 3.5–5.2)
Alkaline Phosphatase: 52 U/L (ref 39–117)
BUN: 12 mg/dL (ref 6–23)
CO2: 28 mEq/L (ref 19–32)
Calcium: 9.4 mg/dL (ref 8.4–10.5)
Chloride: 104 mEq/L (ref 96–112)
Creatinine, Ser: 0.74 mg/dL (ref 0.40–1.20)
GFR: 87.84 mL/min (ref >60.00)
Glucose, Bld: 86 mg/dL (ref 70–99)
Potassium: 3.9 mEq/L (ref 3.5–5.1)
Sodium: 140 mEq/L (ref 135–145)
Total Bilirubin: 0.6 mg/dL (ref 0.2–1.2)
Total Protein: 6.6 g/dL (ref 6.0–8.3)

## 2018-06-20 LAB — LIPID PANEL
Cholesterol: 227 mg/dL — ABNORMAL HIGH (ref 0–200)
HDL: 77.3 mg/dL (ref >39.00)
LDL Cholesterol: 137 mg/dL — ABNORMAL HIGH (ref 0–99)
NonHDL: 150.08
Total CHOL/HDL Ratio: 3
Triglycerides: 65 mg/dL (ref 0.0–149.0)
VLDL: 13 mg/dL (ref 0.0–40.0)

## 2018-06-20 LAB — HEMOGLOBIN A1C: Hgb A1c MFr Bld: 5.4 % (ref 4.6–6.5)

## 2018-06-20 LAB — TSH: TSH: 1.52 u[IU]/mL (ref 0.35–4.50)

## 2018-06-21 ENCOUNTER — Ambulatory Visit (INDEPENDENT_AMBULATORY_CARE_PROVIDER_SITE_OTHER): Payer: 59 | Admitting: Physician Assistant

## 2018-06-21 ENCOUNTER — Encounter: Payer: Self-pay | Admitting: Physician Assistant

## 2018-06-21 DIAGNOSIS — R635 Abnormal weight gain: Secondary | ICD-10-CM | POA: Diagnosis not present

## 2018-06-21 DIAGNOSIS — E669 Obesity, unspecified: Secondary | ICD-10-CM | POA: Diagnosis not present

## 2018-06-21 DIAGNOSIS — Z713 Dietary counseling and surveillance: Secondary | ICD-10-CM

## 2018-06-21 NOTE — Progress Notes (Signed)
Virtual Visit via Video   I connected with Shannon Gordon on 06/21/18 at  1:20 PM EDT by a video enabled telemedicine application and verified that I am speaking with the correct person using two identifiers. Location patient: Home Location provider: [facility], Office Persons participating in the virtual visit: Clara Herbison, Inda Coke PA-C, Anselmo Pickler, LPN   I discussed the limitations of evaluation and management by telemedicine and the availability of in person appointments. The patient expressed understanding and agreed to proceed.  I acted as a Education administrator for [employer], PA-C [employer], LPN  Subjective:   HPI:  Nutrition Counseling Pt to discuss diet and nutrition. She would like to lose 20-25 lbs, and learn about a healthy diet. Pt is not exercising at present due to her back.  Got down to 150 lb by doing [facility], basically a high-fat/low-CHO diet. Went on a cruise and started eating more sugar and has had weight gain.  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  Dietary recall: Wakes up at [place] -- 9:30 -- coffee w/ half-half x 1; 10:30-11 -- chobani flip yogurt OR protein bar Building control surveyor) with water Lunch -- 1 -- diet coke; pizza lunchable OR sandwich (ham or cheese/bacon) OR wrap; maybe a few chips Afternoon snack -- protein bar OR yogurt Dinner -- 6-7 -- fried chicken and green beans OR steak OR rice/WW toast Snacks --- occasional dessert, ice cream/cookie, cereal Beverages -- glass of wine (4-6 oz) nightly   Take out -- [employer] (pasta), wings/homemade potato chips, pizza, cheeseburger  Vegetables -- steamed broccoli with cheese, shredded carrots, lettuce  Weight: Wt Readings from Last 3 Encounters:  06/19/18 175 lb (79.4 kg)  12/25/17 169 lb 6.1 oz (76.8 kg)  12/14/17 167 lb 12.8 oz (76.1 kg)    Exercise: Recent back injury, planning to do PT soon here Was a member of the [employer] prior to pandemic, enjoys swimming   Support system: Good support system, husband Does most of cooking/grocery shopping  Sleep: "night owl"; denies nighttime eating, stops eating about an hour prior to sleep  Goals: 1- lose weight 2- eat less sweets 3- exercise more  Estimated daily energy needs: Calories: 1500-1650 kcal Protein: 60-75 g Fluid: at least 2000 ml  ROS: See pertinent positives and negatives per HPI.  Patient Active Problem List   Diagnosis Date Noted  . Anxiety 05/02/2017  . Weight gain 05/02/2017  . Positive ANA (antinuclear antibody) 05/02/2017  . Arthralgia 05/02/2017    Social History   Tobacco Use  . Smoking status: Former Smoker    Packs/day: 0.50    Years: 10.00    Pack years: 5.00    Types: Cigarettes    Last attempt to quit: 2013    Years since quitting: 7.3  . Smokeless tobacco: Never Used  Substance Use Topics  . Alcohol use: Yes    Comment: occ use only     Current Outpatient Medications:  .  cetirizine (ZYRTEC) 10 MG tablet, Take 1 tablet (10 mg total) by mouth 2 (two) times daily., Disp: 30 tablet, Rfl: 5 .  hydrOXYzine (ATARAX/VISTARIL) 25 MG tablet, Take 1 tablet (25 mg total) by mouth every 8 (eight) hours as needed for anxiety or itching (insomnia)., Disp: 60 tablet, Rfl: 5 .  omeprazole (PRILOSEC) 10 MG capsule, Take 10 mg by mouth daily., Disp: , Rfl:  .  ondansetron (ZOFRAN) 4 MG tablet, Take 1 tablet (4 mg total) by mouth every 8 (eight) hours  as needed for nausea or vomiting., Disp: 30 tablet, Rfl: 0 .  triamcinolone cream (KENALOG) 0.1 %, , Disp: , Rfl:   No Known Allergies  Objective:   VITALS: Per patient if applicable, see vitals. GENERAL: Alert, appears well and in no acute distress. HEENT: Atraumatic, conjunctiva clear, no obvious abnormalities on inspection of external nose and ears. NECK: Normal movements of the head and neck. CARDIOPULMONARY: No increased WOB. Speaking in clear sentences. I:E ratio WNL.  MS: Moves all visible extremities without  noticeable abnormality. PSYCH: Pleasant and cooperative, well-groomed. Speech normal rate and rhythm. Affect is appropriate. Insight and judgement are appropriate. Attention is focused, linear, and appropriate.  NEURO: CN grossly intact. Oriented as arrived to appointment on time with no prompting. Moves both UE equally.  SKIN: No obvious lesions, wounds, erythema, or cyanosis noted on face or hands.  Assessment and Plan:   [name] was seen today for nutrition counseling.  Diagnoses and all orders for this visit:  Weight gain; Encounter for nutritional counseling; Obesity, unspecified classification, unspecified obesity type, unspecified whether serious comorbidity present Discussed specific, individualized recommendations regarding nutrition including decreasing sugary beverages, limiting portions, balancing out meals, and eating regularly throughout the day. Handouts provided included: Balanced Plate, Balanced Snack List, Balanced Breakfast, 1800 Calorie Sample Menus. Provided emotional support and encouraged slow, steady weight loss. Patient's questions answered throughout encounter. Follow-up with me prn.  Specific recommendations include:  Here are the recommendations from our visit:  1. Make walking on the treadmill a priority. I would like for you to start with 20 min 3 times a week. May increase as you feel a reasonable goal.  2. Work on eating less high fat foods. Choose more baked products instead of fries.  3. Try to add a non-starchy vegetable with each lunch or dinner and eat this first to fill up (broccoli, salad, etc.)  4. Work on reducing alcohol intake as we discussed during the visit.  5. Use the breakfast handout to help you choose a more balanced breakfast.  . Reviewed expectations re: course of current medical issues. . Discussed self-management of symptoms. . Outlined signs and symptoms indicating need for more acute intervention. . Patient verbalized understanding  and all questions were answered. Marland Kitchen Health Maintenance issues including appropriate healthy diet, exercise, and smoking avoidance were discussed with patient. . See orders for this visit as documented in the electronic medical record.  I discussed the assessment and treatment plan with the patient. The patient was provided an opportunity to ask questions and all were answered. The patient agreed with the plan and demonstrated an understanding of the instructions.   The patient was advised to call back or seek an in-person evaluation if the symptoms worsen or if the condition fails to improve as anticipated.   [name] or LPN served as scribe during this visit. History, Physical, and Plan performed by medical provider. The above documentation has been reviewed and is accurate and complete.  I spent 40 minutes with this patient, greater than 50% was face-to-face time counseling regarding the above diagnoses.  [place], [place] 06/21/2018

## 2018-06-21 NOTE — Patient Instructions (Signed)
It was great to chat with you!  Here are the recommendations from our visit:  1. Make walking on the treadmill a priority. I would like for you to start with 20 min 3 times a week. May increase as you feel a reasonable goal.  2. Work on eating less high fat foods. Choose more baked products instead of fries.  3. Try to add a non-starchy vegetable with each lunch or dinner and eat this first to fill up (broccoli, salad, etc.)  4. Work on reducing alcohol intake as we discussed during the visit.  5. Use the breakfast handout to help you choose a more balanced breakfast.  Let me know if you have any questions,  Aldona Bar

## 2018-06-27 ENCOUNTER — Other Ambulatory Visit: Payer: Self-pay | Admitting: Family Medicine

## 2018-07-02 ENCOUNTER — Encounter: Payer: Self-pay | Admitting: Physical Therapy

## 2018-07-02 ENCOUNTER — Other Ambulatory Visit: Payer: Self-pay

## 2018-07-02 ENCOUNTER — Ambulatory Visit (INDEPENDENT_AMBULATORY_CARE_PROVIDER_SITE_OTHER): Payer: 59 | Admitting: Physical Therapy

## 2018-07-02 DIAGNOSIS — G8929 Other chronic pain: Secondary | ICD-10-CM | POA: Diagnosis not present

## 2018-07-02 DIAGNOSIS — M5442 Lumbago with sciatica, left side: Secondary | ICD-10-CM

## 2018-07-02 NOTE — Patient Instructions (Signed)
Access Code: [email or web address]  URL: [email or web address]  Date: 07/02/2018  Prepared by: Lyndee Hensen   Exercises  Supine Single Knee to Chest - 3 reps - 30 hold - 2x daily  Supine Lower Trunk Rotation - 5 reps - 10 hold - 2x daily  Seated Hamstring Stretch - 3 reps - 30 hold - 2x daily  Supine Hamstring Stretch with Strap - 3 reps - 30 hold - 2x daily  Supine Transversus Abdominis Bracing - Hands on Ground - 10 reps - 5 hold - 2x daily

## 2018-07-02 NOTE — Therapy (Signed)
[facility] [address] [place], [place], [address] Phone: [phone]   Fax:  [phone]  Physical Therapy Evaluation  Patient Details  Name: Shannon Gordon MRN: [id number] Date of Birth: Oct 23, 1980 Referring Provider (PT): Briscoe Deutscher   Encounter Date: 07/02/2018  PT End of Session - 07/02/18 1236    Visit Number  1    Number of Visits  12    Date for PT Re-Evaluation  08/13/18    Authorization Type  UHC    PT Start Time  1105    PT Stop Time  1147    PT Time Calculation (min)  42 min    Activity Tolerance  Patient tolerated treatment well    Behavior During Therapy  [facility] for tasks assessed/performed       Past Medical History:  Diagnosis Date  . Anxiety 05/02/2017   We reviewed hydroxyzine as an option for her anxiety, especially since it is manifested as itching and hives as well.  . Arthralgia 05/02/2017   Patient has been followed by orthopedics in the past.  She does take Mobic and as needed tramadol.  I am okay with taking his medications over.  Owens Shark recluse spider bite   . Positive ANA (antinuclear antibody) 05/02/2017   Patient already follows with rheumatology.  . Urticaria     History reviewed. No pertinent surgical history.  There were no vitals filed for this visit.   Subjective Assessment - 07/02/18 1106    Subjective  Pt states back pain for years. She has 70 lb dog that had surgery, and has had to lift her quite a bit. Pain in low back and into R>L LE., leg pain intermittent. Pain increased with bending, lifting, prolonged sitting. Not currently doing much exercise. Pt works in pre-school 2 days /week.       Limitations  Sitting;Lifting;Standing;House hold activities    Patient Stated Goals  decreased pain, increased ability for activity.     Currently in Pain?  Yes    Pain Score  6     Pain Location  Back    Pain Orientation  Lower    Pain Descriptors / Indicators  Aching    Pain Type  Chronic pain     Pain Onset  More than a month ago    Pain Frequency  Intermittent    Aggravating Factors   Bending, sitting, carrying, house work.          [facility] PT Assessment - 07/02/18 0001      Assessment   Medical Diagnosis  Low Back Pain    Referring Provider (PT)  Briscoe Deutscher    Prior Therapy  years ago      Balance Screen   Has the patient fallen in the past 6 months  No      Prior Function   Level of Independence  Independent      Cognition   Overall Cognitive Status  Within Functional Limits for tasks assessed      Posture/Postural Control   Posture Comments  mild increase in lumbar lordosis,       ROM / Strength   AROM / PROM / Strength  AROM;Strength      AROM   Overall AROM Comments  Lumbar: flexion-mod limitation, tightness,  Ext- mod limitation with pain. Hips: [facility]        Strength   Overall Strength Comments  Hips: 4-/5;  Core: 3-/5 , moderate postural changes seen with bridge exercise.  Palpation   Palpation comment  soreness at central low lumbar region, tightness in bil lumbar paraspinals. Mild tightness in R glute vs L.       Special Tests   Other special tests  Tightness in Bil HS, Mild LLT on R vs L;                 Objective measurements completed on examination: See above findings.      [facility] PT Treatment/Exercise - 07/02/18 0001      Exercises   Exercises  Lumbar      Lumbar Exercises: Stretches   Active Hamstring Stretch  3 reps;30 seconds    Active Hamstring Stretch Limitations  seated    Passive Hamstring Stretch  2 reps;30 seconds    Passive Hamstring Stretch Limitations  supine with strap, and ankle pump for nerve glide x10 bil;     Single Knee to Chest Stretch  3 reps;30 seconds;Right;Left    Lower Trunk Rotation  10 seconds;5 reps      Lumbar Exercises: Supine   Ab Set  5 seconds;10 reps             PT Education - 07/02/18 1236    Education Details  PT POC, HEP    Person(s) Educated  Patient    Methods   Explanation;Demonstration;Verbal cues;Handout    Comprehension  Verbalized understanding       PT Short Term Goals - 07/02/18 1239      PT SHORT TERM GOAL #1   Title  Pt to be independent with initial HEP    Time  2    Period  Weeks    Status  New    Target Date  07/16/18      PT SHORT TERM GOAL #2   Title  Pt to report decreased pain in back to 4/10 with activity     Time  2    Period  Weeks    Status  New    Target Date  07/16/18        PT Long Term Goals - 07/02/18 1239      PT LONG TERM GOAL #1   Title  Pt to be independent with long term HEP for strengthening    Time  6    Period  Weeks    Status  New    Target Date  08/13/18      PT LONG TERM GOAL #2   Title  Pt to demo increased strength of hips and core to at least 4+/5, with only minimal postural changes wtih stability exercises.     Time  6    Period  Weeks    Status  New    Target Date  08/13/18      PT LONG TERM GOAL #3   Title  Pt to demo improved ability for lumbar ROM, to be WNL and pain free for all motions.     Time  6    Period  Weeks    Status  New    Target Date  08/13/18      PT LONG TERM GOAL #4   Title  Pt to demo proper lifting mechanics, to improve safety and pain with home, work, and functional activities.    Time  6    Period  Weeks    Status  New    Target Date  08/13/18             Plan - 07/02/18 1245  Clinical Impression Statement  Pt presents wtih primary complaint of increased pain in low back. She has decreased lumbar ROM, with limtiations and pain with flexion and extension. She is deconditioned, and has weakness in core, which is likely contributing to pain with activity. She will benefit from education on proper lifti/carry techniques. Pt with soreness at low lumbar region, and tightness in surrounding musculature. Pt would likely do well with posture and strength program. Pt with decreased ability for full functional activiites, lifting, squatting, carrying, IADLs,  due to pain, and will benefit from skilled PT to improve.     Examination-Activity Limitations  Bend;Lift;Squat    Examination-Participation Restrictions  Cleaning;Dorita Sciara    Stability/Clinical Decision Making  Stable/Uncomplicated    Clinical Decision Making  Low    Rehab Potential  Good    PT Frequency  2x / week    PT Duration  6 weeks    PT Treatment/Interventions  ADLs/Self Care Home Management;Cryotherapy;Electrical Stimulation;Gait training;Ultrasound;Traction;Moist Heat;Iontophoresis 4mg /ml Dexamethasone;Stair training;Functional mobility training;Therapeutic activities;Therapeutic exercise;Neuromuscular re-education;Manual techniques;Passive range of motion;Dry needling;Taping;Joint Manipulations;Spinal Manipulations    Consulted and Agree with Plan of Care  Patient       Patient will benefit from skilled therapeutic intervention in order to improve the following deficits and impairments:  Increased muscle spasms, Improper body mechanics, Decreased range of motion, Decreased activity tolerance, Decreased strength, Impaired flexibility, Pain  Visit Diagnosis: Chronic bilateral low back pain with left-sided sciatica     Problem List Patient Active Problem List   Diagnosis Date Noted  . Anxiety 05/02/2017  . Weight gain 05/02/2017  . Positive ANA (antinuclear antibody) 05/02/2017  . Arthralgia 05/02/2017   Lyndee Hensen, PT, DPT 12:56 PM  07/02/18    [facility] [place], [place], [address] Phone: [phone]   Fax:  [phone]  Name: Shannon Gordon MRN: [id number] Date of Birth: 1980-05-21

## 2018-07-04 ENCOUNTER — Other Ambulatory Visit: Payer: Self-pay

## 2018-07-04 ENCOUNTER — Encounter: Payer: Self-pay | Admitting: Physical Therapy

## 2018-07-04 ENCOUNTER — Ambulatory Visit (INDEPENDENT_AMBULATORY_CARE_PROVIDER_SITE_OTHER): Payer: 59 | Admitting: Physical Therapy

## 2018-07-04 DIAGNOSIS — M5442 Lumbago with sciatica, left side: Secondary | ICD-10-CM

## 2018-07-04 DIAGNOSIS — G8929 Other chronic pain: Secondary | ICD-10-CM

## 2018-07-07 ENCOUNTER — Encounter: Payer: Self-pay | Admitting: Physical Therapy

## 2018-07-07 NOTE — Therapy (Signed)
[facility] [address] [place], [place], [address] Phone: [phone]   Fax:  [phone]  Physical Therapy Treatment  Patient Details  Name: Coby Antrobus MRN: [id number] Date of Birth: 28-Jun-1980 Referring Provider (PT): Briscoe Deutscher   Encounter Date: 07/04/2018  PT End of Session - 07/07/18 1421    Visit Number  2    Number of Visits  12    Date for PT Re-Evaluation  08/13/18    Authorization Type  UHC    PT Start Time  1202    PT Stop Time  1242    PT Time Calculation (min)  40 min    Activity Tolerance  Patient tolerated treatment well    Behavior During Therapy  [facility] for tasks assessed/performed       Past Medical History:  Diagnosis Date  . Anxiety 05/02/2017   We reviewed hydroxyzine as an option for her anxiety, especially since it is manifested as itching and hives as well.  . Arthralgia 05/02/2017   Patient has been followed by orthopedics in the past.  She does take Mobic and as needed tramadol.  I am okay with taking his medications over.  Owens Shark recluse spider bite   . Positive ANA (antinuclear antibody) 05/02/2017   Patient already follows with rheumatology.  . Urticaria     History reviewed. No pertinent surgical history.  There were no vitals filed for this visit.                    [facility] Adult PT Treatment/Exercise - 07/07/18 0001      Exercises   Exercises  Lumbar      Lumbar Exercises: Stretches   Active Hamstring Stretch  3 reps;30 seconds    Active Hamstring Stretch Limitations  seated    Passive Hamstring Stretch  2 reps;30 seconds    Passive Hamstring Stretch Limitations  supine with strap, and ankle pump for nerve glide x10 bil;     Single Knee to Chest Stretch  3 reps;30 seconds;Right;Left    Lower Trunk Rotation  10 seconds;5 reps      Lumbar Exercises: Aerobic   Stationary Bike  L2 x 8 min      Lumbar Exercises: Standing   Row  20 reps    Theraband Level (Row)  Level 3  (Green)    Row Limitations  with education on TA and core activation    Other Standing Lumbar Exercises  mini squat x10;  Box lift/deadlift from 12 in box x10, with 5 lb weight (for posture and form)       Lumbar Exercises: Supine   Ab Set  5 seconds;10 reps    Clam  20 reps    Clam Limitations  RTB with TA    Bent Knee Raise  20 reps    Bent Knee Raise Limitations  with TA    Bridge  20 reps      Lumbar Exercises: Quadruped   Madcat/Old Horse  10 reps    Opposite Arm/Leg Raise  20 reps    Opposite Arm/Leg Raise Limitations  2x10, LEs only.              PT Education - 07/07/18 1420    Education Details  Reviewed HEP, will add to HEP next visit       PT Short Term Goals - 07/02/18 1239      PT SHORT TERM GOAL #1   Title  Pt to be  independent with initial HEP    Time  2    Period  Weeks    Status  New    Target Date  07/16/18      PT SHORT TERM GOAL #2   Title  Pt to report decreased pain in back to 4/10 with activity     Time  2    Period  Weeks    Status  New    Target Date  07/16/18        PT Long Term Goals - 07/02/18 1239      PT LONG TERM GOAL #1   Title  Pt to be independent with long term HEP for strengthening    Time  6    Period  Weeks    Status  New    Target Date  08/13/18      PT LONG TERM GOAL #2   Title  Pt to demo increased strength of hips and core to at least 4+/5, with only minimal postural changes wtih stability exercises.     Time  6    Period  Weeks    Status  New    Target Date  08/13/18      PT LONG TERM GOAL #3   Title  Pt to demo improved ability for lumbar ROM, to be WNL and pain free for all motions.     Time  6    Period  Weeks    Status  New    Target Date  08/13/18      PT LONG TERM GOAL #4   Title  Pt to demo proper lifting mechanics, to improve safety and pain with home, work, and functional activities.    Time  6    Period  Weeks    Status  New    Target Date  08/13/18            Plan - 07/07/18 1421     Clinical Impression Statement  Core strength progressed today, with education on TA activation. Will add to HEP next visit. Pt requires cuing for form for all core exercises, and has poor stabilization seen with ther ex today. Pt to benefit from continued strengthening.     Examination-Activity Limitations  Bend;Lift;Squat    Examination-Participation Restrictions  Cleaning;Valla Leaver [facility]    Stability/Clinical Decision Making  Stable/Uncomplicated    Rehab Potential  Good    PT Frequency  2x / week    PT Duration  6 weeks    PT Treatment/Interventions  ADLs/Self Care Home Management;Cryotherapy;Electrical Stimulation;Gait training;Ultrasound;Traction;Moist Heat;Iontophoresis 4mg /ml Dexamethasone;Stair training;Functional mobility training;Therapeutic activities;Therapeutic exercise;Neuromuscular re-education;Manual techniques;Passive range of motion;Dry needling;Taping;Joint Manipulations;Spinal Manipulations    Consulted and Agree with Plan of Care  Patient       Patient will benefit from skilled therapeutic intervention in order to improve the following deficits and impairments:  Increased muscle spasms, Improper body mechanics, Decreased range of motion, Decreased activity tolerance, Decreased strength, Impaired flexibility, Pain  Visit Diagnosis: Chronic bilateral low back pain with left-sided sciatica     Problem List Patient Active Problem List   Diagnosis Date Noted  . Anxiety 05/02/2017  . Weight gain 05/02/2017  . Positive ANA (antinuclear antibody) 05/02/2017  . Arthralgia 05/02/2017   Lyndee Hensen, PT, DPT 2:23 PM  07/07/18    [facility] [place], [place], [address] Phone: [phone]   Fax:  [phone]  Name: Iona Stay MRN: [id number] Date of Birth: [birth year]

## 2018-07-09 ENCOUNTER — Encounter: Payer: 59 | Admitting: Physical Therapy

## 2018-07-09 ENCOUNTER — Encounter: Payer: Self-pay | Admitting: Physical Therapy

## 2018-07-09 ENCOUNTER — Ambulatory Visit (INDEPENDENT_AMBULATORY_CARE_PROVIDER_SITE_OTHER): Payer: 59 | Admitting: Physical Therapy

## 2018-07-09 ENCOUNTER — Other Ambulatory Visit: Payer: Self-pay

## 2018-07-09 DIAGNOSIS — M5442 Lumbago with sciatica, left side: Secondary | ICD-10-CM

## 2018-07-09 DIAGNOSIS — G8929 Other chronic pain: Secondary | ICD-10-CM | POA: Diagnosis not present

## 2018-07-09 NOTE — Patient Instructions (Signed)
Access Code: [email or web address]  URL: [email or web address]  Date: 07/09/2018  Prepared by: Faustino Congress   Exercises  Supine Single Knee to Chest - 3 reps - 30 hold - 2x daily  Supine Lower Trunk Rotation - 5 reps - 10 hold - 2x daily  Seated Hamstring Stretch - 3 reps - 30 hold - 2x daily  Supine Hamstring Stretch with Strap - 3 reps - 30 hold - 2x daily  Supine Transversus Abdominis Bracing - Hands on Ground - 10 reps - 5 hold - 2x daily  Hooklying Clamshell with Resistance - 20 reps - 1 sets - 1x daily - 7x weekly  Supine March - 20 reps - 1 sets - 2x daily - 7x weekly  Supine Bridge - 20 reps - 1 sets - 5 sec hold - 2x daily - 7x weekly  Plank on Knees - 5 reps - 1 sets - 30 sec hold - 1x daily - 7x weekly

## 2018-07-09 NOTE — Therapy (Signed)
[facility] [address] [place], [place], [address] Phone: [phone]   Fax:  [phone]  Physical Therapy Treatment  Patient Details  Name: Shannon Gordon MRN: [id number] Date of Birth: 05/21/1980 Referring Provider (PT): Briscoe Deutscher   Encounter Date: 07/09/2018  PT End of Session - 07/09/18 1418    Visit Number  3    Number of Visits  12    Date for PT Re-Evaluation  08/13/18    Authorization Type  UHC    PT Start Time  0955    PT Stop Time  1035    PT Time Calculation (min)  40 min    Activity Tolerance  Patient tolerated treatment well    Behavior During Therapy  [facility] for tasks assessed/performed       Past Medical History:  Diagnosis Date  . Anxiety 05/02/2017   We reviewed hydroxyzine as an option for her anxiety, especially since it is manifested as itching and hives as well.  . Arthralgia 05/02/2017   Patient has been followed by orthopedics in the past.  She does take Mobic and as needed tramadol.  I am okay with taking his medications over.  Owens Shark recluse spider bite   . Positive ANA (antinuclear antibody) 05/02/2017   Patient already follows with rheumatology.  . Urticaria     History reviewed. No pertinent surgical history.  There were no vitals filed for this visit.  Subjective Assessment - 07/09/18 0957    Subjective  doing well.  back feels a little stiff this morning. back is feeling better overall.    Patient Stated Goals  decreased pain, increased ability for activity.     Currently in Pain?  No/denies   just stiffness in back   Pain Onset  More than a month ago                       [facility] Adult PT Treatment/Exercise - 07/09/18 0958      Lumbar Exercises: Stretches   Passive Hamstring Stretch  Right;Left;3 reps;30 seconds    Passive Hamstring Stretch Limitations  supine with strap, and ankle pump for nerve glide x10 bil;     Lower Trunk Rotation  10 seconds;5 reps      Lumbar  Exercises: Aerobic   Stationary Bike  L2 x 8 min      Lumbar Exercises: Supine   Ab Set  5 seconds;10 reps    Clam  20 reps    Clam Limitations  RTB with TA    Bent Knee Raise  20 reps    Bent Knee Raise Limitations  with TA    Bridge  20 reps      Lumbar Exercises: Fayne Mediate  on knees/elbows 5 x30 sec             PT Education - 07/09/18 1417    Education Details  updated HEP    Person(s) Educated  Patient    Methods  Explanation;Demonstration;Handout    Comprehension  Verbalized understanding       PT Short Term Goals - 07/02/18 1239      PT SHORT TERM GOAL #1   Title  Pt to be independent with initial HEP    Time  2    Period  Weeks    Status  New    Target Date  07/16/18      PT SHORT TERM GOAL #2   Title  Pt to  report decreased pain in back to 4/10 with activity     Time  2    Period  Weeks    Status  New    Target Date  07/16/18        PT Long Term Goals - 07/02/18 1239      PT LONG TERM GOAL #1   Title  Pt to be independent with long term HEP for strengthening    Time  6    Period  Weeks    Status  New    Target Date  08/13/18      PT LONG TERM GOAL #2   Title  Pt to demo increased strength of hips and core to at least 4+/5, with only minimal postural changes wtih stability exercises.     Time  6    Period  Weeks    Status  New    Target Date  08/13/18      PT LONG TERM GOAL #3   Title  Pt to demo improved ability for lumbar ROM, to be WNL and pain free for all motions.     Time  6    Period  Weeks    Status  New    Target Date  08/13/18      PT LONG TERM GOAL #4   Title  Pt to demo proper lifting mechanics, to improve safety and pain with home, work, and functional activities.    Time  6    Period  Weeks    Status  New    Target Date  08/13/18            Plan - 07/09/18 1419    Clinical Impression Statement  Pt tolerated session well today with additional core activities.  Progressing well with PT with no reports of  pain at this time.  Will cotninue to benefit from strengthening program.    Examination-Activity Limitations  Bend;Lift;Squat    Examination-Participation Restrictions  Cleaning;Valla Leaver [facility]    Stability/Clinical Decision Making  Stable/Uncomplicated    Rehab Potential  Good    PT Frequency  2x / week    PT Duration  6 weeks    PT Treatment/Interventions  ADLs/Self Care Home Management;Cryotherapy;Electrical Stimulation;Gait training;Ultrasound;Traction;Moist Heat;Iontophoresis 4mg /ml Dexamethasone;Stair training;Functional mobility training;Therapeutic activities;Therapeutic exercise;Neuromuscular re-education;Manual techniques;Passive range of motion;Dry needling;Taping;Joint Manipulations;Spinal Manipulations    PT Next Visit Plan  continue hip/core strengthening    Consulted and Agree with Plan of Care  Patient       Patient will benefit from skilled therapeutic intervention in order to improve the following deficits and impairments:  Increased muscle spasms, Improper body mechanics, Decreased range of motion, Decreased activity tolerance, Decreased strength, Impaired flexibility, Pain  Visit Diagnosis: Chronic bilateral low back pain with left-sided sciatica     Problem List Patient Active Problem List   Diagnosis Date Noted  . Anxiety 05/02/2017  . Weight gain 05/02/2017  . Positive ANA (antinuclear antibody) 05/02/2017  . Arthralgia 05/02/2017      Laureen Abrahams, PT, DPT 07/09/18 2:20 PM     [place], [place], [address] Phone: [phone]   Fax:  [phone]  Name: Shannon Gordon MRN: [id number] Date of Birth: [birth year]

## 2018-07-11 ENCOUNTER — Ambulatory Visit (INDEPENDENT_AMBULATORY_CARE_PROVIDER_SITE_OTHER): Payer: 59 | Admitting: Physical Therapy

## 2018-07-11 ENCOUNTER — Encounter: Payer: Self-pay | Admitting: Physical Therapy

## 2018-07-11 ENCOUNTER — Other Ambulatory Visit: Payer: Self-pay

## 2018-07-11 DIAGNOSIS — M5442 Lumbago with sciatica, left side: Secondary | ICD-10-CM

## 2018-07-11 DIAGNOSIS — G8929 Other chronic pain: Secondary | ICD-10-CM | POA: Diagnosis not present

## 2018-07-11 NOTE — Therapy (Signed)
[facility] [address] [place], [place], [address] Phone: [phone]   Fax:  [phone]  Physical Therapy Treatment  Patient Details  Name: Shannon Gordon MRN: [id number] Date of Birth: [birth year] Referring Provider (PT): Briscoe Deutscher   Encounter Date: 07/11/2018  PT End of Session - 07/11/18 0908    Visit Number  4    Number of Visits  12    Date for PT Re-Evaluation  08/13/18    Authorization Type  UHC    PT Start Time  0905    PT Stop Time  0951    PT Time Calculation (min)  46 min    Activity Tolerance  Patient tolerated treatment well    Behavior During Therapy  [facility] for tasks assessed/performed       Past Medical History:  Diagnosis Date  . Anxiety 05/02/2017   We reviewed hydroxyzine as an option for her anxiety, especially since it is manifested as itching and hives as well.  . Arthralgia 05/02/2017   Patient has been followed by orthopedics in the past.  She does take Mobic and as needed tramadol.  I am okay with taking his medications over.  Owens Shark recluse spider bite   . Positive ANA (antinuclear antibody) 05/02/2017   Patient already follows with rheumatology.  . Urticaria     History reviewed. No pertinent surgical history.  There were no vitals filed for this visit.  Subjective Assessment - 07/11/18 0906    Subjective  Pt states she has had some soreness, she was sleeping on the couch last night.    Limitations  House hold activities;Lifting;Standing;Walking    Currently in Pain?  Yes    Pain Score  5     Pain Location  Back    Pain Orientation  Lower    Pain Descriptors / Indicators  Aching    Pain Type  Acute pain    Pain Onset  More than a month ago    Pain Frequency  Intermittent         [facility] PT Assessment - 07/11/18 0001      AROM   Overall AROM Comments  Flexion: WFL, minimal pain,  Extension: increased pain, with single and repeated motions                   [facility] Adult PT  Treatment/Exercise - 07/11/18 0909      Lumbar Exercises: Stretches   Passive Hamstring Stretch  Right;Left;3 reps;30 seconds    Passive Hamstring Stretch Limitations  supine with strap, and ankle pump for nerve glide x10 bil;     Lower Trunk Rotation  --      Lumbar Exercises: Aerobic   Stationary Bike  L2 x 8 min      Lumbar Exercises: Standing   Row  20 reps    Theraband Level (Row)  Level 3 (Green)    Row Limitations  with TA    Other Standing Lumbar Exercises  squats x20, 10 with no weight, 10 with 10 lb;       Lumbar Exercises: Supine   Ab Set  --    Clam  20 reps    Clam Limitations  RTB with TA    Bent Knee Raise  20 reps    Bent Knee Raise Limitations  with TA    Bridge  20 reps    Bridge Limitations  Bridge dips 2x5;     Straight Leg Raise  10 reps  Straight Leg Raises Limitations  Bil      Lumbar Exercises: Quadruped   Madcat/Old Horse  10 reps    Opposite Arm/Leg Raise  20 reps    Opposite Arm/Leg Raise Limitations  On PBall, LEs only     Plank  on knees/elbows 5 x30 sec      Manual Therapy   Manual Therapy  Joint mobilization    Joint Mobilization  PA mobs to lumbar spine gr 3;              PT Education - 07/11/18 0908    Education Details  HEP reviewed    Person(s) Educated  Patient    Methods  Explanation    Comprehension  Verbalized understanding       PT Short Term Goals - 07/02/18 1239      PT SHORT TERM GOAL #1   Title  Pt to be independent with initial HEP    Time  2    Period  Weeks    Status  New    Target Date  07/16/18      PT SHORT TERM GOAL #2   Title  Pt to report decreased pain in back to 4/10 with activity     Time  2    Period  Weeks    Status  New    Target Date  07/16/18        PT Long Term Goals - 07/02/18 1239      PT LONG TERM GOAL #1   Title  Pt to be independent with long term HEP for strengthening    Time  6    Period  Weeks    Status  New    Target Date  08/13/18      PT LONG TERM GOAL #2    Title  Pt to demo increased strength of hips and core to at least 4+/5, with only minimal postural changes wtih stability exercises.     Time  6    Period  Weeks    Status  New    Target Date  08/13/18      PT LONG TERM GOAL #3   Title  Pt to demo improved ability for lumbar ROM, to be WNL and pain free for all motions.     Time  6    Period  Weeks    Status  New    Target Date  08/13/18      PT LONG TERM GOAL #4   Title  Pt to demo proper lifting mechanics, to improve safety and pain with home, work, and functional activities.    Time  6    Period  Weeks    Status  New    Target Date  08/13/18            Plan - 07/11/18 0956    Clinical Impression Statement  Pt improving with ability for ther ex, but continues to demonstrate weakness, and requires cuing for TA activation. She has been able to progress exercises with minimal pain. She does have most pain with AROM for lumbar extension in standing and prone.    Examination-Activity Limitations  Bend;Lift;Squat    Examination-Participation Restrictions  Cleaning;Valla Leaver [facility]    Stability/Clinical Decision Making  Stable/Uncomplicated    Rehab Potential  Good    PT Frequency  2x / week    PT Duration  6 weeks    PT Treatment/Interventions  ADLs/Self Care Home Management;Cryotherapy;Electrical Stimulation;Gait training;Ultrasound;Traction;Moist Heat;Iontophoresis 4mg /ml Dexamethasone;Stair  training;Functional mobility training;Therapeutic activities;Therapeutic exercise;Neuromuscular re-education;Manual techniques;Passive range of motion;Dry needling;Taping;Joint Manipulations;Spinal Manipulations    PT Next Visit Plan  continue hip/core strengthening    Consulted and Agree with Plan of Care  Patient       Patient will benefit from skilled therapeutic intervention in order to improve the following deficits and impairments:  Increased muscle spasms, Improper body mechanics, Decreased range of motion, Decreased activity  tolerance, Decreased strength, Impaired flexibility, Pain  Visit Diagnosis: Chronic bilateral low back pain with left-sided sciatica     Problem List Patient Active Problem List   Diagnosis Date Noted  . Anxiety 05/02/2017  . Weight gain 05/02/2017  . Positive ANA (antinuclear antibody) 05/02/2017  . Arthralgia 05/02/2017   Lyndee Hensen, PT, DPT 9:57 AM  07/11/18    [facility] [place], [place], [address] Phone: [phone]   Fax:  [phone]  Name: Shannon Gordon MRN: [id number] Date of Birth: 05/10/1980

## 2018-07-16 ENCOUNTER — Other Ambulatory Visit: Payer: Self-pay

## 2018-07-16 ENCOUNTER — Encounter: Payer: Self-pay | Admitting: Physical Therapy

## 2018-07-16 ENCOUNTER — Ambulatory Visit (INDEPENDENT_AMBULATORY_CARE_PROVIDER_SITE_OTHER): Payer: 59 | Admitting: Physical Therapy

## 2018-07-16 DIAGNOSIS — G8929 Other chronic pain: Secondary | ICD-10-CM

## 2018-07-16 DIAGNOSIS — M5442 Lumbago with sciatica, left side: Secondary | ICD-10-CM

## 2018-07-16 NOTE — Therapy (Signed)
[facility] [address] [place], [place], [address] Phone: [phone]   Fax:  [phone]  Physical Therapy Treatment  Patient Details  Name: Shannon Gordon MRN: [id number] Date of Birth: 06/26/1980 Referring Provider (PT): Briscoe Deutscher   Encounter Date: 07/16/2018  PT End of Session - 07/16/18 1403    Visit Number  5    Number of Visits  12    Date for PT Re-Evaluation  08/13/18    Authorization Type  UHC    PT Start Time  9622    PT Stop Time  1439    PT Time Calculation (min)  42 min    Activity Tolerance  Patient tolerated treatment well    Behavior During Therapy  [facility] for tasks assessed/performed       Past Medical History:  Diagnosis Date  . Anxiety 05/02/2017   We reviewed hydroxyzine as an option for her anxiety, especially since it is manifested as itching and hives as well.  . Arthralgia 05/02/2017   Patient has been followed by orthopedics in the past.  She does take Mobic and as needed tramadol.  I am okay with taking his medications over.  Owens Shark recluse spider bite   . Positive ANA (antinuclear antibody) 05/02/2017   Patient already follows with rheumatology.  . Urticaria     History reviewed. No pertinent surgical history.  There were no vitals filed for this visit.  Subjective Assessment - 07/16/18 1402    Subjective  PT states variable soreness, more this last week.    Patient Stated Goals  decreased pain, increased ability for activity.     Currently in Pain?  Yes    Pain Score  5     Pain Location  Back    Pain Orientation  Lower    Pain Descriptors / Indicators  Aching    Pain Type  Acute pain    Pain Onset  More than a month ago    Pain Frequency  Intermittent                       [facility] Adult PT Treatment/Exercise - 07/16/18 1405      Lumbar Exercises: Stretches   Passive Hamstring Stretch  Right;Left;3 reps;30 seconds    Passive Hamstring Stretch Limitations  manual    Lower  Trunk Rotation  10 seconds;5 reps    Pelvic Tilt  20 reps    Quadruped Mid Back Stretch  30 seconds;2 reps    Quadruped Mid Back Stretch Limitations  Childs pose      Lumbar Exercises: Aerobic   Stationary Bike  L2 x 8 min      Lumbar Exercises: Standing   Row  20 reps    Theraband Level (Row)  Level 3 (Green)    Other Standing Lumbar Exercises  squats x20, with 5 lb;     Other Standing Lumbar Exercises  UE flex and ext with GTB and TA x20 each;       Lumbar Exercises: Supine   Clam  --    Clam Limitations  --    Bent Knee Raise  --    Bent Knee Raise Limitations  --    Bridge  20 reps    Bridge Limitations  --    Straight Leg Raise  10 reps    Straight Leg Raises Limitations  Bil      Lumbar Exercises: Quadruped   Madcat/Old Horse  10 reps  Opposite Arm/Leg Raise  20 reps    Opposite Arm/Leg Raise Limitations  LEs only     Plank  on knees/elbows: 3x 40 sec      Manual Therapy   Manual Therapy  Joint mobilization    Joint Mobilization  Long leg distraction 20 sec x5 bil;                PT Short Term Goals - 07/02/18 1239      PT SHORT TERM GOAL #1   Title  Pt to be independent with initial HEP    Time  2    Period  Weeks    Status  New    Target Date  07/16/18      PT SHORT TERM GOAL #2   Title  Pt to report decreased pain in back to 4/10 with activity     Time  2    Period  Weeks    Status  New    Target Date  07/16/18        PT Long Term Goals - 07/02/18 1239      PT LONG TERM GOAL #1   Title  Pt to be independent with long term HEP for strengthening    Time  6    Period  Weeks    Status  New    Target Date  08/13/18      PT LONG TERM GOAL #2   Title  Pt to demo increased strength of hips and core to at least 4+/5, with only minimal postural changes wtih stability exercises.     Time  6    Period  Weeks    Status  New    Target Date  08/13/18      PT LONG TERM GOAL #3   Title  Pt to demo improved ability for lumbar ROM, to be WNL and  pain free for all motions.     Time  6    Period  Weeks    Status  New    Target Date  08/13/18      PT LONG TERM GOAL #4   Title  Pt to demo proper lifting mechanics, to improve safety and pain with home, work, and functional activities.    Time  6    Period  Weeks    Status  New    Target Date  08/13/18            Plan - 07/16/18 1455    Clinical Impression Statement  Pt continues to have variable pain. Concentrated today on education of pelvic tilt and slight tuck with TA for finding neutral pelvis, as well as holding neutral pelivs with strengthening exercises. Pt challenged with ther ex, due to weakness, and has increased pain with increased lumbar extension or increasred lordotic posture.    Examination-Activity Limitations  Bend;Lift;Squat    Examination-Participation Restrictions  Cleaning;Valla Leaver [facility]    Stability/Clinical Decision Making  Stable/Uncomplicated    Rehab Potential  Good    PT Frequency  2x / week    PT Duration  6 weeks    PT Treatment/Interventions  ADLs/Self Care Home Management;Cryotherapy;Electrical Stimulation;Gait training;Ultrasound;Traction;Moist Heat;Iontophoresis 4mg /ml Dexamethasone;Stair training;Functional mobility training;Therapeutic activities;Therapeutic exercise;Neuromuscular re-education;Manual techniques;Passive range of motion;Dry needling;Taping;Joint Manipulations;Spinal Manipulations    PT Next Visit Plan  continue hip/core strengthening    Consulted and Agree with Plan of Care  Patient       Patient will benefit from skilled therapeutic intervention in order to improve the  following deficits and impairments:  Increased muscle spasms, Improper body mechanics, Decreased range of motion, Decreased activity tolerance, Decreased strength, Impaired flexibility, Pain  Visit Diagnosis: 1. Chronic bilateral low back pain with left-sided sciatica        Problem List Patient Active Problem List   Diagnosis Date Noted  . Anxiety  05/02/2017  . Weight gain 05/02/2017  . Positive ANA (antinuclear antibody) 05/02/2017  . Arthralgia 05/02/2017    Lyndee Hensen, PT, DPT 2:56 PM  07/16/18    [facility] [place], [place], [address] Phone: [phone]   Fax:  [phone]  Name: Shannon Gordon MRN: [id number] Date of Birth: Jun 25, 1980

## 2018-07-18 ENCOUNTER — Ambulatory Visit (INDEPENDENT_AMBULATORY_CARE_PROVIDER_SITE_OTHER): Payer: 59 | Admitting: Physical Therapy

## 2018-07-18 ENCOUNTER — Other Ambulatory Visit: Payer: Self-pay

## 2018-07-18 ENCOUNTER — Encounter: Payer: Self-pay | Admitting: Physical Therapy

## 2018-07-18 DIAGNOSIS — M5442 Lumbago with sciatica, left side: Secondary | ICD-10-CM

## 2018-07-18 DIAGNOSIS — G8929 Other chronic pain: Secondary | ICD-10-CM

## 2018-07-18 NOTE — Therapy (Signed)
[facility] [address] [place], [place], [address] Phone: [phone]   Fax:  [phone]  Physical Therapy Treatment  Patient Details  Name: Shannon Gordon MRN: [id number] Date of Birth: 05-23-1980 Referring Provider (PT): Briscoe Deutscher   Encounter Date: 07/18/2018  PT End of Session - 07/18/18 0806    Visit Number  6    Number of Visits  12    Date for PT Re-Evaluation  08/13/18    Authorization Type  UHC    PT Start Time  0802    PT Stop Time  0846    PT Time Calculation (min)  44 min    Activity Tolerance  Patient tolerated treatment well    Behavior During Therapy  [facility] for tasks assessed/performed       Past Medical History:  Diagnosis Date  . Anxiety 05/02/2017   We reviewed hydroxyzine as an option for her anxiety, especially since it is manifested as itching and hives as well.  . Arthralgia 05/02/2017   Patient has been followed by orthopedics in the past.  She does take Mobic and as needed tramadol.  I am okay with taking his medications over.  Owens Shark recluse spider bite   . Positive ANA (antinuclear antibody) 05/02/2017   Patient already follows with rheumatology.  . Urticaria     History reviewed. No pertinent surgical history.  There were no vitals filed for this visit.  Subjective Assessment - 07/18/18 0805    Subjective  Pt states minimal pain this am.    Limitations  House hold activities;Lifting;Standing;Walking    Patient Stated Goals  decreased pain, increased ability for activity.     Currently in Pain?  Yes    Pain Score  3     Pain Location  Back    Pain Orientation  Lower    Pain Descriptors / Indicators  Aching    Pain Type  Acute pain    Pain Onset  More than a month ago    Pain Frequency  Intermittent                       [facility] Adult PT Treatment/Exercise - 07/18/18 0807      Lumbar Exercises: Stretches   Active Hamstring Stretch  3 reps;30 seconds    Active Hamstring  Stretch Limitations  seated    Passive Hamstring Stretch  --    Passive Hamstring Stretch Limitations  --    Lower Trunk Rotation  10 seconds;5 reps    Pelvic Tilt  20 reps    Quadruped Mid Back Stretch  --    Quadruped Mid Back Stretch Limitations  --    Other Lumbar Stretch Exercise  hip flexor stretch, kneeling and thomas test position x2 min each;       Lumbar Exercises: Aerobic   Stationary Bike  L2 x 8 min      Lumbar Exercises: Standing   Row  20 reps    Theraband Level (Row)  Level 3 (Green)    Other Standing Lumbar Exercises  --    Other Standing Lumbar Exercises  UE flex,  ext  and rot with GTB and TA x 15  each;       Lumbar Exercises: Supine   Bridge  20 reps    Straight Leg Raise  10 reps    Straight Leg Raises Limitations  Bil      Lumbar Exercises: Quadruped   Madcat/Old Horse  --  Opposite Arm/Leg Raise  20 reps    Opposite Arm/Leg Raise Limitations  On PBall, LEs only     Plank  --      Manual Therapy   Manual Therapy  Joint mobilization    Joint Mobilization  Long leg distraction 20 sec x5 bil;              PT Education - 07/18/18 0017    Education Details  Reviewed HEP optimal posture.    Person(s) Educated  Patient    Methods  Explanation    Comprehension  Verbalized understanding       PT Short Term Goals - 07/18/18 0807      PT SHORT TERM GOAL #1   Title  Pt to be independent with initial HEP    Time  2    Period  Weeks    Status  Achieved    Target Date  07/16/18      PT SHORT TERM GOAL #2   Title  Pt to report decreased pain in back to 4/10 with activity     Time  2    Period  Weeks    Status  Partially Met    Target Date  07/16/18        PT Long Term Goals - 07/02/18 1239      PT LONG TERM GOAL #1   Title  Pt to be independent with long term HEP for strengthening    Time  6    Period  Weeks    Status  New    Target Date  08/13/18      PT LONG TERM GOAL #2   Title  Pt to demo increased strength of hips and core to  at least 4+/5, with only minimal postural changes wtih stability exercises.     Time  6    Period  Weeks    Status  New    Target Date  08/13/18      PT LONG TERM GOAL #3   Title  Pt to demo improved ability for lumbar ROM, to be WNL and pain free for all motions.     Time  6    Period  Weeks    Status  New    Target Date  08/13/18      PT LONG TERM GOAL #4   Title  Pt to demo proper lifting mechanics, to improve safety and pain with home, work, and functional activities.    Time  6    Period  Weeks    Status  New    Target Date  08/13/18            Plan - 07/18/18 1323    Clinical Impression Statement  Hip flexor stretching added today, in attempts for improving upright posture and lumbar extension, pt still with increased pain with extension. Continued and progressed strengthening as well. Pt to benefit from continued care.    Examination-Activity Limitations  Bend;Lift;Squat    Examination-Participation Restrictions  Cleaning;Valla Leaver [facility]    Stability/Clinical Decision Making  Stable/Uncomplicated    Rehab Potential  Good    PT Frequency  2x / week    PT Duration  6 weeks    PT Treatment/Interventions  ADLs/Self Care Home Management;Cryotherapy;Electrical Stimulation;Gait training;Ultrasound;Traction;Moist Heat;Iontophoresis 53m/ml Dexamethasone;Stair training;Functional mobility training;Therapeutic activities;Therapeutic exercise;Neuromuscular re-education;Manual techniques;Passive range of motion;Dry needling;Taping;Joint Manipulations;Spinal Manipulations    PT Next Visit Plan  continue hip/core strengthening    Consulted and Agree with Plan of Care  Patient       Patient will benefit from skilled therapeutic intervention in order to improve the following deficits and impairments:  Increased muscle spasms, Improper body mechanics, Decreased range of motion, Decreased activity tolerance, Decreased strength, Impaired flexibility, Pain  Visit Diagnosis: 1. Chronic  bilateral low back pain with left-sided sciatica        Problem List Patient Active Problem List   Diagnosis Date Noted  . Anxiety 05/02/2017  . Weight gain 05/02/2017  . Positive ANA (antinuclear antibody) 05/02/2017  . Arthralgia 05/02/2017    Lyndee Hensen, PT, DPT 1:26 PM  07/18/18    [facility] [place], [place], [address] Phone: [phone]   Fax:  [phone]  Name: Shannon Gordon MRN: [id number] Date of Birth: 11-12-1980

## 2018-07-22 ENCOUNTER — Other Ambulatory Visit: Payer: Self-pay | Admitting: Family Medicine

## 2018-07-22 DIAGNOSIS — R21 Rash and other nonspecific skin eruption: Secondary | ICD-10-CM

## 2018-07-23 ENCOUNTER — Encounter: Payer: Self-pay | Admitting: Physical Therapy

## 2018-07-23 ENCOUNTER — Ambulatory Visit (INDEPENDENT_AMBULATORY_CARE_PROVIDER_SITE_OTHER): Payer: 59 | Admitting: Physical Therapy

## 2018-07-23 ENCOUNTER — Other Ambulatory Visit: Payer: Self-pay

## 2018-07-23 DIAGNOSIS — G8929 Other chronic pain: Secondary | ICD-10-CM | POA: Diagnosis not present

## 2018-07-23 DIAGNOSIS — M5442 Lumbago with sciatica, left side: Secondary | ICD-10-CM

## 2018-07-23 NOTE — Therapy (Signed)
[facility] [address] [place], [place], [address] Phone: [phone]   Fax:  [phone]  Physical Therapy Treatment  Patient Details  Name: Shannon Gordon MRN: [id number] Date of Birth: Mar 12, 1980 Referring Provider (PT): Briscoe Deutscher   Encounter Date: 07/23/2018  PT End of Session - 07/23/18 1455    Visit Number  7    Number of Visits  12    Date for PT Re-Evaluation  08/13/18    Authorization Type  UHC    PT Start Time  1400    PT Stop Time  1454    PT Time Calculation (min)  54 min    Activity Tolerance  Patient tolerated treatment well    Behavior During Therapy  [facility] for tasks assessed/performed       Past Medical History:  Diagnosis Date  . Anxiety 05/02/2017   We reviewed hydroxyzine as an option for her anxiety, especially since it is manifested as itching and hives as well.  . Arthralgia 05/02/2017   Patient has been followed by orthopedics in the past.  She does take Mobic and as needed tramadol.  I am okay with taking his medications over.  Owens Shark recluse spider bite   . Positive ANA (antinuclear antibody) 05/02/2017   Patient already follows with rheumatology.  . Urticaria     History reviewed. No pertinent surgical history.  There were no vitals filed for this visit.  Subjective Assessment - 07/23/18 1454    Subjective  minimal pain today, has been trying to do more with HEP    Currently in Pain?  Yes    Pain Score  4     Pain Location  Back    Pain Orientation  Left;Right;Lower    Pain Descriptors / Indicators  Aching    Pain Onset  More than a month ago    Pain Frequency  Intermittent    Aggravating Factors   Bending, squatting, lifting, housework.                       [facility] Adult PT Treatment/Exercise - 07/23/18 1357      Lumbar Exercises: Stretches   Active Hamstring Stretch  --    Active Hamstring Stretch Limitations  --    Lower Trunk Rotation  --    Pelvic Tilt  10 reps     Quadruped Mid Back Stretch  30 seconds;4 reps    Quadruped Mid Back Stretch Limitations  Childs pose    Other Lumbar Stretch Exercise  hip flexor stretch, kneeling x3 min      Lumbar Exercises: Aerobic   Stationary Bike  L2 x 8 min      Lumbar Exercises: Standing   Row  --    Theraband Level (Row)  --    Other Standing Lumbar Exercises  Hip abd and ext YTB x20 each;       Lumbar Exercises: Supine   Bridge  20 reps    Bridge Limitations  Bridge dips x10    Straight Leg Raise  10 reps    Straight Leg Raises Limitations  with opp UE rot      Lumbar Exercises: Quadruped   Opposite Arm/Leg Raise  --    Opposite Arm/Leg Raise Limitations  --    Plank  on knees/elbows: 3x 45 sec      Manual Therapy   Manual Therapy  Joint mobilization;Soft tissue mobilization    Soft tissue mobilization  STM/DTM to  bil lumbar paraspinals and QL region.              PT Education - 07/23/18 1444    Education Details  HEP updated    Person(s) Educated  Patient    Methods  Explanation;Handout    Comprehension  Verbalized understanding;Returned demonstration;Verbal cues required;Tactile cues required;Need further instruction       PT Short Term Goals - 07/18/18 0807      PT SHORT TERM GOAL #1   Title  Pt to be independent with initial HEP    Time  2    Period  Weeks    Status  Achieved    Target Date  07/16/18      PT SHORT TERM GOAL #2   Title  Pt to report decreased pain in back to 4/10 with activity     Time  2    Period  Weeks    Status  Partially Met    Target Date  07/16/18        PT Long Term Goals - 07/02/18 1239      PT LONG TERM GOAL #1   Title  Pt to be independent with long term HEP for strengthening    Time  6    Period  Weeks    Status  New    Target Date  08/13/18      PT LONG TERM GOAL #2   Title  Pt to demo increased strength of hips and core to at least 4+/5, with only minimal postural changes wtih stability exercises.     Time  6    Period  Weeks     Status  New    Target Date  08/13/18      PT LONG TERM GOAL #3   Title  Pt to demo improved ability for lumbar ROM, to be WNL and pain free for all motions.     Time  6    Period  Weeks    Status  New    Target Date  08/13/18      PT LONG TERM GOAL #4   Title  Pt to demo proper lifting mechanics, to improve safety and pain with home, work, and functional activities.    Time  6    Period  Weeks    Status  New    Target Date  08/13/18            Plan - 07/23/18 1456    Clinical Impression Statement  DTM done to decrease tightness in lumbar paraspinals today. Pt educated on achieving active glute contraction with ther ex, HEP updated for this. Pt with difficulty with this, and has overextension of lumbar spine with pain. Pt to benefit from continued strengthening and stabilization.    Examination-Activity Limitations  Bend;Lift;Squat    Examination-Participation Restrictions  Cleaning;[facility]aver [facility]    Stability/Clinical Decision Making  Stable/Uncomplicated    Rehab Potential  Good    PT Frequency  2x / week    PT Duration  6 weeks    PT Treatment/Interventions  ADLs/Self Care Home Management;Cryotherapy;Electrical Stimulation;Gait training;Ultrasound;Traction;Moist Heat;Iontophoresis 43m/ml Dexamethasone;Stair training;Functional mobility training;Therapeutic activities;Therapeutic exercise;Neuromuscular re-education;Manual techniques;Passive range of motion;Dry needling;Taping;Joint Manipulations;Spinal Manipulations    PT Next Visit Plan  continue hip/core strengthening    Consulted and Agree with Plan of Care  Patient       Patient will benefit from skilled therapeutic intervention in order to improve the following deficits and impairments:  Increased muscle spasms, Improper body  mechanics, Decreased range of motion, Decreased activity tolerance, Decreased strength, Impaired flexibility, Pain  Visit Diagnosis: 1. Chronic bilateral low back pain with left-sided sciatica         Problem List Patient Active Problem List   Diagnosis Date Noted  . Anxiety 05/02/2017  . Weight gain 05/02/2017  . Positive ANA (antinuclear antibody) 05/02/2017  . Arthralgia 05/02/2017    Lyndee Hensen, PT, DPT 2:59 PM  07/23/18    [facility] [place], [place], [address] Phone: [phone]   Fax:  [phone]  Name: Shannon Gordon MRN: [id number] Date of Birth: 1980-04-09

## 2018-07-23 NOTE — Patient Instructions (Signed)
Access Code: [email or web address]  URL: [email or web address]  Date: 07/23/2018  Prepared by: Lyndee Hensen   Exercises Supine Single Knee to Chest - 3 reps - 30 hold - 2x daily Supine Lower Trunk Rotation - 5 reps - 10 hold - 2x daily Seated Hamstring Stretch - 3 reps - 30 hold - 2x daily Supine Hamstring Stretch with Strap - 3 reps - 30 hold - 2x daily Supine Transversus Abdominis Bracing - Hands on Ground - 10 reps - 5 hold - 2x daily Hooklying Clamshell with Resistance - 20 reps - 1 sets - 1x daily - 7x weekly Supine March - 20 reps - 1 sets - 2x daily - 7x weekly Supine Bridge - 20 reps - 1 sets - 5 sec hold - 2x daily - 7x weekly Plank on Knees - 5 reps - 1 sets - 30 sec hold - 1x daily - 7x weekly Thomas Stretch on Table - 3 reps - 30 hold - 2x daily Half Kneeling Hip Flexor Stretch - 3 reps - 30 hold - 2x daily Standing Repeated Hip Abduction with Resistance - 10 reps - 2 sets - 1x daily Standing Repeated Hip Extension with Resistance - 10 reps - 2 sets - 1x daily

## 2018-07-25 ENCOUNTER — Encounter: Payer: Self-pay | Admitting: Physical Therapy

## 2018-07-25 ENCOUNTER — Other Ambulatory Visit: Payer: Self-pay

## 2018-07-25 ENCOUNTER — Ambulatory Visit (INDEPENDENT_AMBULATORY_CARE_PROVIDER_SITE_OTHER): Payer: 59 | Admitting: Physical Therapy

## 2018-07-25 DIAGNOSIS — G8929 Other chronic pain: Secondary | ICD-10-CM

## 2018-07-25 DIAGNOSIS — M5442 Lumbago with sciatica, left side: Secondary | ICD-10-CM

## 2018-07-25 NOTE — Therapy (Signed)
[facility] [address] [place], [place], [address] Phone: [phone]   Fax:  [phone]  Physical Therapy Treatment  Patient Details  Name: Shannon Gordon MRN: [id number] Date of Birth: 1980-03-30 Referring Provider (PT): Briscoe Deutscher   Encounter Date: 07/25/2018  PT End of Session - 07/25/18 0910    Visit Number  8    Number of Visits  12    Date for PT Re-Evaluation  08/13/18    Authorization Type  UHC    PT Start Time  0905    PT Stop Time  0950    PT Time Calculation (min)  45 min    Activity Tolerance  Patient tolerated treatment well    Behavior During Therapy  [facility] for tasks assessed/performed       Past Medical History:  Diagnosis Date  . Anxiety 05/02/2017   We reviewed hydroxyzine as an option for her anxiety, especially since it is manifested as itching and hives as well.  . Arthralgia 05/02/2017   Patient has been followed by orthopedics in the past.  She does take Mobic and as needed tramadol.  I am okay with taking his medications over.  Owens Shark recluse spider bite   . Positive ANA (antinuclear antibody) 05/02/2017   Patient already follows with rheumatology.  . Urticaria     History reviewed. No pertinent surgical history.  There were no vitals filed for this visit.  Subjective Assessment - 07/25/18 0909    Subjective  Pt states variable pain, depending on activity. Radiating pain down leg has subsided.    Limitations  Lifting;Walking;House hold activities    Patient Stated Goals  decreased pain, increased ability for activity.     Currently in Pain?  Yes    Pain Score  3     Pain Location  Back    Pain Orientation  Right;Left;Lower    Pain Descriptors / Indicators  Aching    Pain Type  Acute pain    Pain Onset  More than a month ago    Pain Frequency  Intermittent    Aggravating Factors   bending, squatting, lifting, houswork                       [facility] Adult PT Treatment/Exercise -  07/25/18 0911      Lumbar Exercises: Stretches   Lower Trunk Rotation  10 seconds;5 reps    Pelvic Tilt  10 reps    Quadruped Mid Back Stretch  30 seconds;2 reps    Quadruped Mid Back Stretch Limitations  Childs pose    Other Lumbar Stretch Exercise  --      Lumbar Exercises: Aerobic   Stationary Bike  L2 x 8 min      Lumbar Exercises: Standing   Row  20 reps    Theraband Level (Row)  Level 3 (Green)    Other Standing Lumbar Exercises  hip hinge x10, squats, and deadlift motions,  x10 unweighted, then both x10 with 10 lb weight.     Other Standing Lumbar Exercises  Hip abd and ext YTB x20 each;       Lumbar Exercises: Supine   Bridge  --    Bridge Limitations  --    Bridge with clamshell  20 reps    Bridge with [employer] Limitations  RTB    Straight Leg Raise  20 reps    Straight Leg Raises Limitations  with opp UE rot  Lumbar Exercises: Prone   Other Prone Lumbar Exercises  Hip ext x10 bil;       Lumbar Exercises: Fayne Mediate  --      Manual Therapy   Manual Therapy  Joint mobilization;Soft tissue mobilization;Passive ROM    Joint Mobilization  Ant hip mobs gr 3     Soft tissue mobilization  --    Passive ROM  manual hip flexor stretching.               PT Short Term Goals - 07/18/18 0807      PT SHORT TERM GOAL #1   Title  Pt to be independent with initial HEP    Time  2    Period  Weeks    Status  Achieved    Target Date  07/16/18      PT SHORT TERM GOAL #2   Title  Pt to report decreased pain in back to 4/10 with activity     Time  2    Period  Weeks    Status  Partially Met    Target Date  07/16/18        PT Long Term Goals - 07/02/18 1239      PT LONG TERM GOAL #1   Title  Pt to be independent with long term HEP for strengthening    Time  6    Period  Weeks    Status  New    Target Date  08/13/18      PT LONG TERM GOAL #2   Title  Pt to demo increased strength of hips and core to at least 4+/5, with only minimal postural  changes wtih stability exercises.     Time  6    Period  Weeks    Status  New    Target Date  08/13/18      PT LONG TERM GOAL #3   Title  Pt to demo improved ability for lumbar ROM, to be WNL and pain free for all motions.     Time  6    Period  Weeks    Status  New    Target Date  08/13/18      PT LONG TERM GOAL #4   Title  Pt to demo proper lifting mechanics, to improve safety and pain with home, work, and functional activities.    Time  6    Period  Weeks    Status  New    Target Date  08/13/18            Plan - 07/25/18 1209    Clinical Impression Statement  Pt requiring less cuing for TA stabilization. Continued education on doing this at home with IADLS and lifting. Practiced proper form for hip hinge, deadlift and squat today. Minimal/no pain in back when performed correctly. Plan to progress as tolerated.    Examination-Activity Limitations  Bend;Lift;Squat    Examination-Participation Restrictions  Cleaning;[facility]alla Leaver [facility]    Stability/Clinical Decision Making  Stable/Uncomplicated    Rehab Potential  Good    PT Frequency  2x / week    PT Duration  6 weeks    PT Treatment/Interventions  ADLs/Self Care Home Management;Cryotherapy;Electrical Stimulation;Gait training;Ultrasound;Traction;Moist Heat;Iontophoresis 63m/ml Dexamethasone;Stair training;Functional mobility training;Therapeutic activities;Therapeutic exercise;Neuromuscular re-education;Manual techniques;Passive range of motion;Dry needling;Taping;Joint Manipulations;Spinal Manipulations    PT Next Visit Plan  continue hip/core strengthening    Consulted and Agree with Plan of Care  Patient       Patient will  benefit from skilled therapeutic intervention in order to improve the following deficits and impairments:  Increased muscle spasms, Improper body mechanics, Decreased range of motion, Decreased activity tolerance, Decreased strength, Impaired flexibility, Pain  Visit Diagnosis: 1. Chronic bilateral  low back pain with left-sided sciatica        Problem List Patient Active Problem List   Diagnosis Date Noted  . Anxiety 05/02/2017  . Weight gain 05/02/2017  . Positive ANA (antinuclear antibody) 05/02/2017  . Arthralgia 05/02/2017    Lyndee Hensen, PT, DPT 12:10 PM  07/25/18    [facility] [place], [place], [address] Phone: [phone]   Fax:  [phone]  Name: Shannon Gordon MRN: [id number] Date of Birth: 14-Sep-1980

## 2018-07-29 ENCOUNTER — Encounter: Payer: Self-pay | Admitting: Physical Therapy

## 2018-07-29 ENCOUNTER — Other Ambulatory Visit: Payer: Self-pay

## 2018-07-29 ENCOUNTER — Ambulatory Visit (INDEPENDENT_AMBULATORY_CARE_PROVIDER_SITE_OTHER): Payer: 59 | Admitting: Physical Therapy

## 2018-07-29 DIAGNOSIS — G8929 Other chronic pain: Secondary | ICD-10-CM

## 2018-07-29 DIAGNOSIS — M5442 Lumbago with sciatica, left side: Secondary | ICD-10-CM

## 2018-07-29 NOTE — Therapy (Signed)
[facility] [address] [place], [place], [address] Phone: [phone]   Fax:  [phone]  Physical Therapy Treatment  Patient Details  Name: Shannon Gordon MRN: [id number] Date of Birth: November 21, 1980 Referring Provider (PT): Briscoe Deutscher   Encounter Date: 07/29/2018  PT End of Session - 07/29/18 1109    Visit Number  9    Number of Visits  12    Date for PT Re-Evaluation  08/13/18    Authorization Type  UHC    PT Start Time  1102    PT Stop Time  1147    PT Time Calculation (min)  45 min    Activity Tolerance  Patient tolerated treatment well    Behavior During Therapy  [facility] for tasks assessed/performed       Past Medical History:  Diagnosis Date  . Anxiety 05/02/2017   We reviewed hydroxyzine as an option for her anxiety, especially since it is manifested as itching and hives as well.  . Arthralgia 05/02/2017   Patient has been followed by orthopedics in the past.  She does take Mobic and as needed tramadol.  I am okay with taking his medications over.  Owens Shark recluse spider bite   . Positive ANA (antinuclear antibody) 05/02/2017   Patient already follows with rheumatology.  . Urticaria     History reviewed. No pertinent surgical history.  There were no vitals filed for this visit.  Subjective Assessment - 07/29/18 1106    Subjective  Pt states pain is improving but variable. She is still sleeping on couch some due to dog, which she feels is making back sore when she wakes up.    Limitations  Lifting;Walking;House hold activities    Patient Stated Goals  decreased pain, increased ability for activity.     Currently in Pain?  Yes    Pain Score  4     Pain Location  Back    Pain Orientation  Right;Left    Pain Descriptors / Indicators  Aching    Pain Type  Acute pain    Pain Onset  More than a month ago    Pain Frequency  Intermittent                       [facility] Adult PT Treatment/Exercise - 07/29/18  0001      Lumbar Exercises: Stretches   Lower Trunk Rotation  10 seconds;5 reps    Pelvic Tilt  10 reps    Quadruped Mid Back Stretch  30 seconds;2 reps    Quadruped Mid Back Stretch Limitations  Childs pose    Other Lumbar Stretch Exercise  hip flexor stretch,thomas test position x3 min      Lumbar Exercises: Aerobic   Stationary Bike  L2 x 8 min      Lumbar Exercises: Standing   Row  20 reps    Theraband Level (Row)  Level 3 (Green)    Other Standing Lumbar Exercises  deadlift motion x10 ; front squats x20 with 10 lb weight.     Other Standing Lumbar Exercises  Hip abd and ext YTB x20 each;       Lumbar Exercises: Supine   Bridge  20 reps    Bridge Limitations  Bridge dips x10    Bridge with clamshell  --    Bridge with [employer] Limitations  --    Straight Leg Raise  20 reps    Straight Leg Raises Limitations  with opp UE rot      Lumbar Exercises: Prone   Opposite Arm/Leg Raise  20 reps    Opposite Arm/Leg Raise Limitations  on PBall    Other Prone Lumbar Exercises  --      Lumbar Exercises: Quadruped   Plank  on knees/elbows: 3x 45 sec    Other Quadruped Lumbar Exercises  High plank 15 sec x2;       Manual Therapy   Manual Therapy  Joint mobilization;Soft tissue mobilization;Passive ROM    Joint Mobilization  Ant hip mobs gr 3     Passive ROM  manual hip flexor stretching.               PT Short Term Goals - 07/18/18 0807      PT SHORT TERM GOAL #1   Title  Pt to be independent with initial HEP    Time  2    Period  Weeks    Status  Achieved    Target Date  07/16/18      PT SHORT TERM GOAL #2   Title  Pt to report decreased pain in back to 4/10 with activity     Time  2    Period  Weeks    Status  Partially Met    Target Date  07/16/18        PT Long Term Goals - 07/02/18 1239      PT LONG TERM GOAL #1   Title  Pt to be independent with long term HEP for strengthening    Time  6    Period  Weeks    Status  New    Target Date   08/13/18      PT LONG TERM GOAL #2   Title  Pt to demo increased strength of hips and core to at least 4+/5, with only minimal postural changes wtih stability exercises.     Time  6    Period  Weeks    Status  New    Target Date  08/13/18      PT LONG TERM GOAL #3   Title  Pt to demo improved ability for lumbar ROM, to be WNL and pain free for all motions.     Time  6    Period  Weeks    Status  New    Target Date  08/13/18      PT LONG TERM GOAL #4   Title  Pt to demo proper lifting mechanics, to improve safety and pain with home, work, and functional activities.    Time  6    Period  Weeks    Status  New    Target Date  08/13/18            Plan - 07/29/18 1216    Clinical Impression Statement  Pt wtih improving pain with home activities. She is showing improvments in ability for hip ext, without increased back pain, and improved ability for strengthening without back pain. Pt progressing well, plan to continue strengthening/stabilization, and work towards d/c as pain improves.    Examination-Activity Limitations  Bend;Lift;Squat    Examination-Participation Restrictions  Cleaning;Valla Leaver [facility]    Stability/Clinical Decision Making  Stable/Uncomplicated    Rehab Potential  Good    PT Frequency  2x / week    PT Duration  6 weeks    PT Treatment/Interventions  ADLs/Self Care Home Management;Cryotherapy;Electrical Stimulation;Gait training;Ultrasound;Traction;Moist Heat;Iontophoresis 6m/ml Dexamethasone;Stair training;Functional mobility training;Therapeutic activities;Therapeutic exercise;Neuromuscular re-education;Manual techniques;Passive range  of motion;Dry needling;Taping;Joint Manipulations;Spinal Manipulations    PT Next Visit Plan  continue hip/core strengthening    Consulted and Agree with Plan of Care  Patient       Patient will benefit from skilled therapeutic intervention in order to improve the following deficits and impairments:  Increased muscle spasms,  Improper body mechanics, Decreased range of motion, Decreased activity tolerance, Decreased strength, Impaired flexibility, Pain  Visit Diagnosis: 1. Chronic bilateral low back pain with left-sided sciatica        Problem List Patient Active Problem List   Diagnosis Date Noted  . Anxiety 05/02/2017  . Weight gain 05/02/2017  . Positive ANA (antinuclear antibody) 05/02/2017  . Arthralgia 05/02/2017    Lyndee Hensen, PT, DPT 12:18 PM  07/29/18    [facility] [place], [place], [address] Phone: [phone]   Fax:  [phone]  Name: Shannon Gordon MRN: [id number] Date of Birth: 1980/10/21

## 2018-08-01 ENCOUNTER — Encounter: Payer: 59 | Admitting: Physical Therapy

## 2018-08-05 ENCOUNTER — Encounter: Payer: 59 | Admitting: Physical Therapy

## 2018-08-07 ENCOUNTER — Encounter: Payer: 59 | Admitting: Physical Therapy

## 2018-08-08 ENCOUNTER — Encounter: Payer: 59 | Admitting: Physical Therapy

## 2018-08-13 ENCOUNTER — Encounter: Payer: 59 | Admitting: Physical Therapy

## 2018-08-14 ENCOUNTER — Ambulatory Visit (INDEPENDENT_AMBULATORY_CARE_PROVIDER_SITE_OTHER): Payer: 59 | Admitting: Physical Therapy

## 2018-08-14 ENCOUNTER — Encounter: Payer: Self-pay | Admitting: Physical Therapy

## 2018-08-14 ENCOUNTER — Other Ambulatory Visit: Payer: Self-pay

## 2018-08-14 DIAGNOSIS — G8929 Other chronic pain: Secondary | ICD-10-CM

## 2018-08-14 DIAGNOSIS — M5442 Lumbago with sciatica, left side: Secondary | ICD-10-CM | POA: Diagnosis not present

## 2018-08-14 NOTE — Therapy (Signed)
[facility] [address] [place], [place], [address] Phone: [phone]   Fax:  [phone]  Physical Therapy Treatment/Re-cert/ Discharge   Patient Details  Name: Shannon Gordon MRN: [id number] Date of Birth: 1980-07-17 Referring Provider (PT): Briscoe Deutscher   Encounter Date: 08/14/2018  PT End of Session - 08/14/18 1005    Visit Number  10    Number of Visits  12    Date for PT Re-Evaluation  08/14/18    Authorization Type  [employer]    PT Start Time  0955    PT Stop Time  1038    PT Time Calculation (min)  43 min    Activity Tolerance  Patient tolerated treatment well    Behavior During Therapy  [facility] for tasks assessed/performed       Past Medical History:  Diagnosis Date  . Anxiety 05/02/2017   We reviewed hydroxyzine as an option for her anxiety, especially since it is manifested as itching and hives as well.  . Arthralgia 05/02/2017   Patient has been followed by orthopedics in the past.  She does take Mobic and as needed tramadol.  I am okay with taking his medications over.  Owens Shark recluse spider bite   . Positive ANA (antinuclear antibody) 05/02/2017   Patient already follows with rheumatology.  . Urticaria     History reviewed. No pertinent surgical history.  There were no vitals filed for this visit.  Subjective Assessment - 08/14/18 0959    Subjective  Pt has been unable to attend PT for a couple weeks, due to child care. She states back is bothering her much less.    Patient Stated Goals  decreased pain, increased ability for activity.     Currently in Pain?  Yes    Pain Score  3     Pain Location  Back    Pain Orientation  Right;Left    Pain Descriptors / Indicators  Aching    Pain Type  Acute pain    Pain Onset  More than a month ago    Pain Frequency  Intermittent         [facility] PT Assessment - 08/14/18 0001      AROM   Overall AROM Comments  WFL, very mild pain with repeated extension      Strength   Overall Strength Comments  hips:4+/5, core : 4/5                    [facility] Adult PT Treatment/Exercise - 08/14/18 1032      Lumbar Exercises: Stretches   Lower Trunk Rotation  --    Pelvic Tilt  --    Quadruped Mid Back Stretch  30 seconds;2 reps    Quadruped Mid Back Stretch Limitations  Childs pose    Other Lumbar Stretch Exercise  hip flexor stretch,kneeling      Lumbar Exercises: Aerobic   Stationary Bike  L2 x 8 min      Lumbar Exercises: Standing   Row  20 reps    Theraband Level (Row)  Level 3 (Green)    Other Standing Lumbar Exercises  front squats x20 with 10 lb weight.     Other Standing Lumbar Exercises  Hip abd and ext YTB x20 each;       Lumbar Exercises: Supine   Bridge  --    Bridge Limitations  --    Bridge with [employer]  10 reps    Bridge with  clamshell  10 reps    Single Leg Bridge  10 reps    Straight Leg Raise  --    Straight Leg Raises Limitations  --      Lumbar Exercises: Prone   Opposite Arm/Leg Raise  --    Opposite Arm/Leg Raise Limitations  --      Lumbar Exercises: Quadruped   Opposite Arm/Leg Raise  20 reps    Plank  on knees/elbows: 3x 45 sec    Other Quadruped Lumbar Exercises  --      Manual Therapy   Manual Therapy  Joint mobilization;Soft tissue mobilization;Passive ROM    Joint Mobilization  --    Passive ROM  --               PT Short Term Goals - 08/14/18 1006      PT SHORT TERM GOAL #1   Title  Pt to be independent with initial HEP    Time  2    Period  Weeks    Status  Achieved    Target Date  07/16/18      PT SHORT TERM GOAL #2   Title  Pt to report decreased pain in back to 4/10 with activity     Time  2    Period  Weeks    Status  Achieved    Target Date  07/16/18        PT Long Term Goals - 08/14/18 1103      PT LONG TERM GOAL #1   Title  Pt to be independent with long term HEP for strengthening    Time  6    Period  Weeks    Status  Achieved      PT LONG TERM GOAL #2   Title   Pt to demo increased strength of hips and core to at least 4+/5, with only minimal postural changes wtih stability exercises.     Time  6    Period  Weeks    Status  Achieved      PT LONG TERM GOAL #3   Title  Pt to demo improved ability for lumbar ROM, to be WNL and pain free for all motions.     Time  6    Period  Weeks    Status  Partially Met      PT LONG TERM GOAL #4   Title  Pt to demo proper lifting mechanics, to improve safety and pain with home, work, and functional activities.    Time  6    Period  Weeks    Status  Achieved            Plan - 08/14/18 1104    Clinical Impression Statement  Pt has made good improvments. She has much improved strength and stability of core, and has been able to progress ther ex without increased pain. She has significantly less postural changes/increased stability, with ther ex today. Pt with improved postural awareness, and improved lifting mechanics. Pt with improved lumbar ROM, still has mild sorenes with repeated extensions. Pt has met goals, and is ready for d/c to HEP at this time. PT educated on final HEP in detail today. Pt in agreement with plan.    Examination-Activity Limitations  Bend;Lift;Squat    Examination-Participation Restrictions  Cleaning;[facility]    Stability/Clinical Decision Making  Stable/Uncomplicated    Rehab Potential  Good    PT Frequency  2x / week    PT Duration  6 weeks    PT Treatment/Interventions  ADLs/Self Care Home Management;Cryotherapy;Electrical Stimulation;Gait training;Ultrasound;Traction;Moist Heat;Iontophoresis 37m/ml Dexamethasone;Stair training;Functional mobility training;Therapeutic activities;Therapeutic exercise;Neuromuscular re-education;Manual techniques;Passive range of motion;Dry needling;Taping;Joint Manipulations;Spinal Manipulations    Consulted and Agree with Plan of Care  Patient       Patient will benefit from skilled therapeutic intervention in order to improve the  following deficits and impairments:  Increased muscle spasms, Improper body mechanics, Decreased range of motion, Decreased activity tolerance, Decreased strength, Impaired flexibility, Pain  Visit Diagnosis: 1. Chronic bilateral low back pain with left-sided sciatica        Problem List Patient Active Problem List   Diagnosis Date Noted  . Anxiety 05/02/2017  . Weight gain 05/02/2017  . Positive ANA (antinuclear antibody) 05/02/2017  . Arthralgia 05/02/2017    LLyndee Hensen PT, DPT 11:09 AM  08/14/18    [facility] [place] [address]Phone: [phone]  Fax:  [phone] Name: Shannon EspericuetaMRN: [id number] of Birth: [birth year]  PHYSICAL THERAPY DISCHARGE SUMMARY  Visits from Start of Care:: 10  Plan: Patient agrees to discharge.  Patient goals were met. Patient is being discharged due to meeting the stated rehab goals.  ?????      LLyndee Hensen PT, DPT 11:11 AM  08/14/18

## 2018-08-15 ENCOUNTER — Encounter: Payer: 59 | Admitting: Physical Therapy

## 2018-08-16 ENCOUNTER — Ambulatory Visit (INDEPENDENT_AMBULATORY_CARE_PROVIDER_SITE_OTHER): Payer: 59 | Admitting: Internal Medicine

## 2018-08-16 ENCOUNTER — Encounter: Payer: Self-pay | Admitting: Internal Medicine

## 2018-08-16 VITALS — Temp 98.5°F | Ht 64.0 in | Wt 172.0 lb

## 2018-08-16 DIAGNOSIS — Z0289 Encounter for other administrative examinations: Secondary | ICD-10-CM

## 2018-08-16 DIAGNOSIS — J3489 Other specified disorders of nose and nasal sinuses: Secondary | ICD-10-CM | POA: Diagnosis not present

## 2018-08-16 DIAGNOSIS — J329 Chronic sinusitis, unspecified: Secondary | ICD-10-CM

## 2018-08-16 MED ORDER — PREDNISONE 20 MG PO TABS: 20.0000 mg | ORAL_TABLET | Freq: Two times a day (BID) | ORAL | 0 refills | Status: DC

## 2018-08-16 MED ORDER — AMOXICILLIN-POT CLAVULANATE 875-125 MG PO TABS: 1.0000 | ORAL_TABLET | Freq: Two times a day (BID) | ORAL | 0 refills | Status: DC

## 2018-08-16 NOTE — Progress Notes (Signed)
Virtual Visit via Video Note  I connected with@ on 08/16/18 at 10:30 AM EDT by a video enabled telemedicine application and verified that I am speaking with the correct person using two identifiers. Location patient: home Location provider:work e office Persons participating in the virtual visit: patient, provider  [place] national recommendations  regarding COVID 19 pandemic   video visit is advised over in office visit for this patient.  Patient aware  of the limitations of evaluation and management by telemedicine and  availability of in person appointments. and agreed to proceed.   HPI: Shannon Gordon presents for video visit SDA PCP NA  Has had 3 days of   bilatearal facial pain sinus pain she has had in past   Had crown and  Filling donw 4 days ago upper teeth but  Didn't think from that cause didn't go away . Has some pnd and hx of recurrent sinus about 4 per year since moving to [place] last year . nneg allergy testing  Otherwise .  Ex tobacco   No cough fever chills   otc sinus med helps and wears off  Zyrtec Claritin can help some   ROS: See pertinent positives and negatives per HPI. No cp sob fever chills  Ha can go to behind eyes   Past Medical History:  Diagnosis Date  . Anxiety 05/02/2017   We reviewed hydroxyzine as an option for her anxiety, especially since it is manifested as itching and hives as well.  . Arthralgia 05/02/2017   Patient has been followed by orthopedics in the past.  She does take Mobic and as needed tramadol.  I am okay with taking his medications over.  Owens Shark recluse spider bite   . Positive ANA (antinuclear antibody) 05/02/2017   Patient already follows with rheumatology.  . Urticaria     History reviewed. No pertinent surgical history.  Family History  Problem Relation Age of Onset  . Thyroid disease Mother   . Hypercalcemia Mother   . Hypothyroidism Maternal Grandmother     Social History   Tobacco Use  . Smoking status: Former Smoker     Packs/day: 0.50    Years: 10.00    Pack years: 5.00    Types: Cigarettes    Quit date: 2013    Years since quitting: 7.5  . Smokeless tobacco: Never Used  Substance Use Topics  . Alcohol use: Yes    Comment: occ use only   . Drug use: Never      Current Outpatient Medications:  .  cetirizine (ZYRTEC) 10 MG tablet, Take 1 tablet (10 mg total) by mouth 2 (two) times daily., Disp: 30 tablet, Rfl: 5 .  hydrOXYzine (ATARAX/VISTARIL) 25 MG tablet, TAKE 1 TABLET(25 MG) BY MOUTH AT BEDTIME AS NEEDED FOR ANXIETY OR INSOMNIA, Disp: 10 tablet, Rfl: 0 .  omeprazole (PRILOSEC) 10 MG capsule, Take 10 mg by mouth daily., Disp: , Rfl:  .  ondansetron (ZOFRAN) 4 MG tablet, Take 1 tablet (4 mg total) by mouth every 8 (eight) hours as needed for nausea or vomiting., Disp: 30 tablet, Rfl: 0 .  triamcinolone cream (KENALOG) 0.1 %, , Disp: , Rfl:  .  amoxicillin-clavulanate (AUGMENTIN) 875-125 MG tablet, Take 1 tablet by mouth every 12 (twelve) hours. If needed for sinusitis, Disp: 14 tablet, Rfl: 0 .  predniSONE (DELTASONE) 20 MG tablet, Take 1 tablet (20 mg total) by mouth 2 (two) times daily with a meal. If needed for sinusitis, Disp: 10 tablet, Rfl: 0  EXAM: BP Readings from Last 3 Encounters:  06/19/18 108/60  12/25/17 118/78  12/14/17 110/68    VITALS per patient if applicable:  GENERAL: alert, oriented, appears well and in no acute distress HEENT: atraumatic, conjunttiva clear, no obvious abnormalities on inspection of external nose and ears tender over maxillar bilateral but no puffiness   Noted on exam  NECK: normal movements of the head and neck LUNGS: on inspection no signs of respiratory distress, breathing rate appears normal, no obvious gross SOB, gasping or wheezing CV: no obvious cyanosis PSYCH/NEURO: pleasant and cooperative, no obvious depression or anxiety, speech and thought processing grossly intact Lab Results  Component Value Date   WBC 6.3 06/20/2018   HGB 14.0 06/20/2018    HCT 39.9 06/20/2018   PLT 235.0 06/20/2018   GLUCOSE 86 06/20/2018   CHOL 227 (H) 06/20/2018   TRIG 65.0 06/20/2018   HDL 77.30 06/20/2018   LDLCALC 137 (H) 06/20/2018   ALT 14 06/20/2018   AST 18 06/20/2018   NA 140 06/20/2018   K 3.9 06/20/2018   CL 104 06/20/2018   CREATININE 0.74 06/20/2018   BUN 12 06/20/2018   CO2 28 06/20/2018   TSH 1.52 06/20/2018   HGBA1C 5.4 06/20/2018    ASSESSMENT AND PLAN:  Discussed the following assessment and plan:    ICD-10-CM   1. Sinus pain  J34.89   2. Sinusitis, unspecified chronicity, unspecified location  J32.9    hs if recurrent poss underlynig environmental triggers  disc uncertain if bacterial disc prevention   Add afrin for 2 days INCS every day and if needed can add antibiotic and or pred  Disc causes of recurrent sinus issues  And  prevention  Counseled.   Expectant management and discussion of plan and treatment with opportunity to ask questions and all were answered. The patient agreed with the plan and demonstrated an understanding of the instructions.   Advised to call back or seek an in-person evaluation if worsening  or having  further concerns .    Shanon Ace, MD

## 2018-08-16 NOTE — Patient Instructions (Addendum)
There are no preventive care reminders to display for this patient.  Depression screen [facility] 2/9 10/09/2017 08/03/2017 05/02/2017  Decreased Interest 0 0 0  Down, Depressed, Hopeless 0 0 0  PHQ - 2 Score 0 0 0  Altered sleeping 0 0 -  Tired, decreased energy 0 0 -  Change in appetite 0 0 -  Feeling bad or failure about yourself  0 0 -  Trouble concentrating 0 0 -  Moving slowly or fidgety/restless 0 0 -  Suicidal thoughts 0 0 -  PHQ-9 Score 0 0 -  Difficult doing work/chores Not difficult at all - -

## 2018-09-24 ENCOUNTER — Encounter: Payer: Self-pay | Admitting: Family Medicine

## 2018-09-26 ENCOUNTER — Encounter: Payer: Self-pay | Admitting: Family Medicine

## 2018-09-27 NOTE — Telephone Encounter (Signed)
Please see other encounter. Patient has a been to urgent care and has an appointment here Tuesday.

## 2018-10-03 ENCOUNTER — Ambulatory Visit (INDEPENDENT_AMBULATORY_CARE_PROVIDER_SITE_OTHER): Payer: 59 | Admitting: Family Medicine

## 2018-10-03 ENCOUNTER — Encounter: Payer: Self-pay | Admitting: Family Medicine

## 2018-10-03 VITALS — Temp 97.8°F | Ht 64.0 in | Wt 174.0 lb

## 2018-10-03 DIAGNOSIS — J0100 Acute maxillary sinusitis, unspecified: Secondary | ICD-10-CM | POA: Diagnosis not present

## 2018-10-03 MED ORDER — AMOXICILLIN-POT CLAVULANATE 875-125 MG PO TABS: 1.0000 | ORAL_TABLET | Freq: Two times a day (BID) | ORAL | 0 refills | Status: DC

## 2018-10-03 MED ORDER — PREDNISONE 20 MG PO TABS: 40.0000 mg | ORAL_TABLET | Freq: Every day | ORAL | 0 refills | Status: DC

## 2018-10-03 NOTE — Progress Notes (Signed)
Patient: Shannon Gordon MRN: [id number] DOB: 1980/06/17 PCP: Briscoe Deutscher, DO     I connected with Jose Persia on 10/03/18 at 11:20am by a video enabled telemedicine application and verified that I am speaking with the correct person using two identifiers.  Location patient: Home Location provider: [facility], Office Persons participating in this virtual visit: Najely Gillings and [name]   I discussed the limitations of evaluation and management by telemedicine and the availability of in person appointments. The patient expressed understanding and agreed to proceed.   Subjective:  Chief Complaint  Patient presents with  . Sinusitis    HPI: The patient is a 38 y.o. female who presents today for possible sinus infection. She has sinus pressure across her forehead and in her maxillary sinuses. For as long as she can remember she has sinus issues at this time of year. Symptoms started yesterday. Symptoms are mild, but she is fearful that we have a long weekend coming up and this turns into a sinus infection. She started to use flonase this week as well as zyrtec. She does have some TTP over her maxillary sinuses. She has no rhinorrhea. No fever/chills, no covid exposure. 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 Still has sense of smell and taste. No headaches.   Review of Systems  Constitutional: Negative for chills, fatigue and fever.  HENT: Positive for congestion, sinus pressure and sinus pain. Negative for ear pain, sore throat and trouble swallowing.   Eyes: Negative for photophobia and pain.  Respiratory: Negative for cough, chest tightness and shortness of breath.   Cardiovascular: Negative for chest pain, palpitations and leg swelling.  Gastrointestinal: Negative for abdominal pain, diarrhea, nausea and vomiting.  Musculoskeletal: Negative for back pain, myalgias and neck pain.  Neurological: Negative for dizziness and headaches.    Allergies Patient has No  Known Allergies.  Past Medical History Patient  has a past medical history of Anxiety (05/02/2017), Arthralgia (05/02/2017), Brown recluse spider bite, Positive ANA (antinuclear antibody) (05/02/2017), and Urticaria.  Surgical History Patient  has no past surgical history on file.  Family History Pateint's family history includes Hypercalcemia in her mother; Hypothyroidism in her maternal grandmother; Thyroid disease in her mother.  Social History Patient  reports that she quit smoking about 7 years ago. Her smoking use included cigarettes. She has a 5.00 pack-year smoking history. She has never used smokeless tobacco. She reports current alcohol use. She reports that she does not use drugs.    Objective: Vitals:   10/03/18 1049  Temp: 97.8 F (36.6 C)  TempSrc: Skin  Weight: 174 lb (78.9 kg)  Height: 5\' 4"  (1.626 m)    Body mass index is 29.87 kg/m.  Physical Exam Vitals signs reviewed.  Constitutional:      Appearance: Normal appearance.  HENT:     Head: Normocephalic and atraumatic.     Nose: Nose normal. No congestion.     Comments: ttp over maxillary sinuses when she presses Pulmonary:     Effort: Pulmonary effort is normal.  Neurological:     General: No focal deficit present.     Mental Status: She is alert and oriented to person, place, and time.        Assessment/plan: 1. Acute non-recurrent maxillary sinusitis Appears to be viral in nature. Increase flonase to BID, recommended cool mist humidifier as well. Sending in steroid burst and augmentin to start only if symptoms worsen,fever, purulent drainage. She understands risk of antibiotics if not needed.  Side effects of augmentin discussed if does end up taking this. F/u if not getting better.     Return if symptoms worsen or fail to improve.    Orma Flaming, MD [place]  10/03/2018

## 2018-10-08 NOTE — Progress Notes (Signed)
Virtual Visit via Video   Due to the COVID-19 pandemic, this visit was completed with telemedicine (audio/video) technology to reduce patient and provider exposure as well as to preserve personal protective equipment.   I connected with Shannon Gordon by a video enabled telemedicine application and verified that I am speaking with the correct person using two identifiers. Location patient: Home Location provider: [facility], Office Persons participating in the virtual visit: Shannon Gordon and Shannon Deutscher, DO, [name] acting as [name] scribe  I discussed the limitations of evaluation and management by telemedicine and the availability of in person appointments. The patient expressed understanding and agreed to proceed.  Care Team   Patient Care Team: Shannon Deutscher, DO as PCP - General (Family Medicine)  Subjective:   HPI:  Sinus problem Pt c/o sinus problem, was last seen on 9/3 by [name] and was given Rx's for antibiotics and steroids, but told to hold off till symptoms are worse. Pt has not had to take. Is having sinus pressure using Flonase BID and Afrin x 3 days with some relief. No nasal drainage, denies fever.  Pt wants to know at what point do you investigate further what is causing the sinus issues.  Patient Active Problem List   Diagnosis Date Noted  . Anxiety 05/02/2017  . Weight gain 05/02/2017  . Positive ANA (antinuclear antibody) 05/02/2017  . Arthralgia 05/02/2017    Social History   Tobacco Use  . Smoking status: Former Smoker    Packs/day: 0.50    Years: 10.00    Pack years: 5.00    Types: Cigarettes    Quit date: 2013    Years since quitting: 7.7  . Smokeless tobacco: Never Used  Substance Use Topics  . Alcohol use: Yes    Comment: occ use only    Current Outpatient Medications:  .  cetirizine (ZYRTEC) 10 MG tablet, Take 1 tablet (10 mg total) by mouth 2 (two) times daily., Disp: 30 tablet, Rfl: 5 .  fluticasone (FLONASE) 50  MCG/ACT nasal spray, Place into both nostrils daily., Disp: , Rfl:  .  hydrOXYzine (ATARAX/VISTARIL) 25 MG tablet, TAKE 1 TABLET(25 MG) BY MOUTH AT BEDTIME AS NEEDED FOR ANXIETY OR INSOMNIA, Disp: 10 tablet, Rfl: 0 .  omeprazole (PRILOSEC) 10 MG capsule, Take 10 mg by mouth daily., Disp: , Rfl:  .  ondansetron (ZOFRAN) 4 MG tablet, Take 1 tablet (4 mg total) by mouth every 8 (eight) hours as needed for nausea or vomiting., Disp: 30 tablet, Rfl: 0 .  triamcinolone cream (KENALOG) 0.1 %, , Disp: , Rfl:  .  amoxicillin-clavulanate (AUGMENTIN) 875-125 MG tablet, Take 1 tablet by mouth 2 (two) times daily. (Patient not taking: Reported on 10/09/2018), Disp: 20 tablet, Rfl: 0 .  montelukast (SINGULAIR) 10 MG tablet, Take 1 tablet (10 mg total) by mouth at bedtime., Disp: 30 tablet, Rfl: 3 .  predniSONE (DELTASONE) 20 MG tablet, Take 2 tablets (40 mg total) by mouth daily with breakfast. (Patient not taking: Reported on 10/09/2018), Disp: 10 tablet, Rfl: 0  No Known Allergies  Objective:   VITALS: Per patient if applicable, see vitals. GENERAL: Alert, appears well and in no acute distress. HEENT: Atraumatic, conjunctiva clear, no obvious abnormalities on inspection of external nose and ears. NECK: Normal movements of the head and neck. CARDIOPULMONARY: No increased WOB. Speaking in clear sentences. I:E ratio WNL.  MS: Moves all visible extremities without noticeable abnormality. PSYCH: Pleasant and cooperative, well-groomed. Speech normal rate and rhythm. Affect  is appropriate. Insight and judgement are appropriate. Attention is focused, linear, and appropriate.  NEURO: CN grossly intact. Oriented as arrived to appointment on time with no prompting. Moves both UE equally.  SKIN: No obvious lesions, wounds, erythema, or cyanosis noted on face or hands.  Depression screen [facility] 2/9 10/09/2017 08/03/2017 05/02/2017  Decreased Interest 0 0 0  Down, Depressed, Hopeless 0 0 0  PHQ - 2 Score 0 0 0  Altered  sleeping 0 0 -  Tired, decreased energy 0 0 -  Change in appetite 0 0 -  Feeling bad or failure about yourself  0 0 -  Trouble concentrating 0 0 -  Moving slowly or fidgety/restless 0 0 -  Suicidal thoughts 0 0 -  PHQ-9 Score 0 0 -  Difficult doing work/chores Not difficult at all - -   Assessment and Plan:   Shannon Gordon was seen today for sinus problem.  Diagnoses and all orders for this visit:  Bacterial sinusitis Comments: Okay to take Abx and prednisone. Will address ongoing allergic rhinitis with the addition of Singulair.  Allergic rhinitis -     montelukast (SINGULAIR) 10 MG tablet; Take 1 tablet (10 mg total) by mouth at bedtime.   Marland Kitchen COVID-19 Education: The signs and symptoms of COVID-19 were discussed with the patient and how to seek care for testing if needed. The importance of social distancing was discussed today. . Reviewed expectations re: course of current medical issues. . Discussed self-management of symptoms. . Outlined signs and symptoms indicating need for more acute intervention. . Patient verbalized understanding and all questions were answered. Marland Kitchen Health Maintenance issues including appropriate healthy diet, exercise, and smoking avoidance were discussed with patient. . See orders for this visit as documented in the electronic medical record.  Shannon Deutscher, DO  Records requested if needed. Time spent: 15 minutes, of which >50% was spent in obtaining information about her symptoms, reviewing her previous labs, evaluations, and treatments, counseling her about her condition (please see the discussed topics above), and developing a plan to further investigate it; she had a number of questions which I addressed.

## 2018-10-09 ENCOUNTER — Ambulatory Visit (INDEPENDENT_AMBULATORY_CARE_PROVIDER_SITE_OTHER): Payer: 59 | Admitting: Family Medicine

## 2018-10-09 ENCOUNTER — Other Ambulatory Visit: Payer: Self-pay

## 2018-10-09 ENCOUNTER — Encounter: Payer: Self-pay | Admitting: Family Medicine

## 2018-10-09 VITALS — Temp 97.0°F

## 2018-10-09 DIAGNOSIS — J329 Chronic sinusitis, unspecified: Secondary | ICD-10-CM

## 2018-10-09 DIAGNOSIS — B9689 Other specified bacterial agents as the cause of diseases classified elsewhere: Secondary | ICD-10-CM

## 2018-10-09 DIAGNOSIS — J309 Allergic rhinitis, unspecified: Secondary | ICD-10-CM

## 2018-10-09 MED ORDER — MONTELUKAST SODIUM 10 MG PO TABS: 10.0000 mg | ORAL_TABLET | Freq: Every day | ORAL | 3 refills | Status: DC

## 2018-10-12 ENCOUNTER — Encounter: Payer: Self-pay | Admitting: Family Medicine

## 2018-11-01 ENCOUNTER — Encounter: Payer: Self-pay | Admitting: Family Medicine

## 2018-11-12 ENCOUNTER — Ambulatory Visit: Payer: 59

## 2018-11-13 ENCOUNTER — Encounter: Payer: Self-pay | Admitting: Family Medicine

## 2018-11-13 ENCOUNTER — Ambulatory Visit (INDEPENDENT_AMBULATORY_CARE_PROVIDER_SITE_OTHER): Payer: 59

## 2018-11-13 ENCOUNTER — Other Ambulatory Visit: Payer: Self-pay

## 2018-11-13 DIAGNOSIS — J329 Chronic sinusitis, unspecified: Secondary | ICD-10-CM

## 2018-11-13 DIAGNOSIS — Z23 Encounter for immunization: Secondary | ICD-10-CM

## 2018-12-04 DIAGNOSIS — Q178 Other specified congenital malformations of ear: Secondary | ICD-10-CM | POA: Insufficient documentation

## 2018-12-04 DIAGNOSIS — J329 Chronic sinusitis, unspecified: Secondary | ICD-10-CM | POA: Insufficient documentation

## 2018-12-04 DIAGNOSIS — R42 Dizziness and giddiness: Secondary | ICD-10-CM | POA: Insufficient documentation

## 2018-12-10 ENCOUNTER — Other Ambulatory Visit: Payer: Self-pay | Admitting: Otolaryngology

## 2018-12-10 DIAGNOSIS — R42 Dizziness and giddiness: Secondary | ICD-10-CM

## 2018-12-13 DIAGNOSIS — H9202 Otalgia, left ear: Secondary | ICD-10-CM | POA: Insufficient documentation

## 2018-12-20 ENCOUNTER — Other Ambulatory Visit: Payer: Self-pay

## 2018-12-20 DIAGNOSIS — Z20822 Contact with and (suspected) exposure to covid-19: Secondary | ICD-10-CM

## 2018-12-23 LAB — NOVEL CORONAVIRUS, NAA: SARS-CoV-2, NAA: NOT DETECTED

## 2019-01-03 ENCOUNTER — Other Ambulatory Visit: Payer: Self-pay

## 2019-01-03 ENCOUNTER — Ambulatory Visit
Admission: RE | Admit: 2019-01-03 | Discharge: 2019-01-03 | Disposition: A | Discharge status: Home / Self Care | Payer: 59 | Source: Ambulatory Visit | Attending: Otolaryngology | Admitting: Otolaryngology

## 2019-01-03 DIAGNOSIS — R42 Dizziness and giddiness: Secondary | ICD-10-CM

## 2019-01-03 MED ORDER — GADOBENATE DIMEGLUMINE 529 MG/ML IV SOLN
15.0000 mL | Freq: Once | INTRAVENOUS | Status: AC | PRN
  Administered 2019-01-03: 15:00:00 15 mL via INTRAVENOUS

## 2019-01-17 ENCOUNTER — Other Ambulatory Visit: Payer: Self-pay

## 2019-01-17 ENCOUNTER — Ambulatory Visit: Payer: 59 | Attending: Internal Medicine

## 2019-01-17 DIAGNOSIS — Z20822 Contact with and (suspected) exposure to covid-19: Secondary | ICD-10-CM

## 2019-01-18 LAB — NOVEL CORONAVIRUS, NAA: SARS-CoV-2, NAA: NOT DETECTED

## 2019-01-28 DIAGNOSIS — M25562 Pain in left knee: Secondary | ICD-10-CM | POA: Insufficient documentation

## 2019-02-07 ENCOUNTER — Encounter: Payer: 59 | Admitting: Family Medicine

## 2019-02-08 DIAGNOSIS — J3089 Other allergic rhinitis: Secondary | ICD-10-CM | POA: Insufficient documentation

## 2019-02-08 DIAGNOSIS — R070 Pain in throat: Secondary | ICD-10-CM | POA: Insufficient documentation

## 2019-02-24 ENCOUNTER — Ambulatory Visit: Payer: 59 | Attending: Internal Medicine

## 2019-02-24 DIAGNOSIS — Z20822 Contact with and (suspected) exposure to covid-19: Secondary | ICD-10-CM

## 2019-02-25 LAB — NOVEL CORONAVIRUS, NAA: SARS-CoV-2, NAA: NOT DETECTED

## 2019-03-17 ENCOUNTER — Encounter: Payer: 59 | Admitting: Family Medicine

## 2019-04-04 ENCOUNTER — Encounter: Payer: Self-pay | Admitting: Family Medicine

## 2019-04-04 ENCOUNTER — Ambulatory Visit (INDEPENDENT_AMBULATORY_CARE_PROVIDER_SITE_OTHER): Payer: 59 | Admitting: Family Medicine

## 2019-04-04 ENCOUNTER — Other Ambulatory Visit: Payer: Self-pay

## 2019-04-04 VITALS — BP 104/80 | HR 72 | Temp 98.2°F | Ht 64.0 in | Wt 177.2 lb

## 2019-04-04 DIAGNOSIS — Z0000 Encounter for general adult medical examination without abnormal findings: Secondary | ICD-10-CM | POA: Diagnosis not present

## 2019-04-04 LAB — CBC WITH DIFFERENTIAL/PLATELET
Basophils Absolute: 0.1 10*3/uL (ref 0.0–0.1)
Basophils Relative: 1 % (ref 0.0–3.0)
Eosinophils Absolute: 0.1 10*3/uL (ref 0.0–0.7)
Eosinophils Relative: 1.1 % (ref 0.0–5.0)
HCT: 38.4 % (ref 36.0–46.0)
Hemoglobin: 13.1 g/dL (ref 12.0–15.0)
Lymphocytes Relative: 36 % (ref 12.0–46.0)
Lymphs Abs: 2.3 10*3/uL (ref 0.7–4.0)
MCHC: 34.2 g/dL (ref 30.0–36.0)
MCV: 92.3 fl (ref 78.0–100.0)
Monocytes Absolute: 0.4 10*3/uL (ref 0.1–1.0)
Monocytes Relative: 5.6 % (ref 3.0–12.0)
Neutro Abs: 3.6 10*3/uL (ref 1.4–7.7)
Neutrophils Relative %: 56.3 % (ref 43.0–77.0)
Platelets: 261 10*3/uL (ref 150.0–400.0)
RBC: 4.16 Mil/uL (ref 3.87–5.11)
RDW: 13.4 % (ref 11.5–15.5)
WBC: 6.4 10*3/uL (ref 4.0–10.5)

## 2019-04-04 LAB — LIPID PANEL
Cholesterol: 208 mg/dL — ABNORMAL HIGH (ref 0–200)
HDL: 65.2 mg/dL (ref >39.00)
LDL Cholesterol: 132 mg/dL — ABNORMAL HIGH (ref 0–99)
NonHDL: 142.87
Total CHOL/HDL Ratio: 3
Triglycerides: 53 mg/dL (ref 0.0–149.0)
VLDL: 10.6 mg/dL (ref 0.0–40.0)

## 2019-04-04 LAB — COMPREHENSIVE METABOLIC PANEL
ALT: 13 U/L (ref 0–35)
AST: 19 U/L (ref 0–37)
Albumin: 4.4 g/dL (ref 3.5–5.2)
Alkaline Phosphatase: 51 U/L (ref 39–117)
BUN: 16 mg/dL (ref 6–23)
CO2: 29 mEq/L (ref 19–32)
Calcium: 9.9 mg/dL (ref 8.4–10.5)
Chloride: 103 mEq/L (ref 96–112)
Creatinine, Ser: 0.76 mg/dL (ref 0.40–1.20)
GFR: 84.82 mL/min (ref >60.00)
Glucose, Bld: 92 mg/dL (ref 70–99)
Potassium: 4.2 mEq/L (ref 3.5–5.1)
Sodium: 138 mEq/L (ref 135–145)
Total Bilirubin: 0.4 mg/dL (ref 0.2–1.2)
Total Protein: 6.9 g/dL (ref 6.0–8.3)

## 2019-04-04 LAB — VITAMIN D 25 HYDROXY (VIT D DEFICIENCY, FRACTURES): VITD: 49.56 ng/mL (ref 30.00–100.00)

## 2019-04-04 LAB — TSH: TSH: 1.44 u[IU]/mL (ref 0.35–4.50)

## 2019-04-04 NOTE — Progress Notes (Signed)
Patient: Shannon Gordon MRN: [id number] DOB: [birth year] PCP: No primary care provider on file.     Subjective:  Chief Complaint  Patient presents with  . transfer of care    no issues or concerns  . Annual Exam    HPI: The patient is a 39 y.o. female who presents today for annual exam and transferring care.  She denies any changes to past medical history. There have been no recent hospitalizations. They are following a well balanced diet and exercise plan. Weight has been stable. No complaints today.   No first degree relative with breast cancer. No first degree relative with colon cancer. Colon cancer in her maternal grandmother, maternal aunt, maternal great grandfather.   Getting covid shot #1 on Sunday.   Immunization History  Administered Date(s) Administered  . Influenza,inj,Quad PF,6+ Mos 02/05/2018, 11/13/2018  . Influenza-Unspecified 11/21/2012, 11/19/2013, 11/23/2015  . Rho (D) Immune Globulin 05/23/2012  . Td 07/04/2017   Colonoscopy: routine screening at 39 years of age.  Mammogram: routine screening.  Pap smear: 03/2017. Wnl. Followed by gynecology.   Review of Systems  Constitutional: Negative for chills, fatigue and fever.  HENT: Negative for dental problem, ear pain, hearing loss and trouble swallowing.   Eyes: Negative for visual disturbance.  Respiratory: Negative for cough, chest tightness and shortness of breath.   Cardiovascular: Negative for chest pain, palpitations and leg swelling.  Gastrointestinal: Negative for abdominal pain, blood in stool, diarrhea and nausea.  Endocrine: Negative for cold intolerance, polydipsia, polyphagia and polyuria.  Genitourinary: Negative for dysuria and hematuria.  Musculoskeletal: Negative for arthralgias.  Skin: Negative for rash.  Neurological: Negative for dizziness and headaches.  Psychiatric/Behavioral: Negative for dysphoric mood and sleep disturbance. The patient is not nervous/anxious.      Allergies Patient has No Known Allergies.  Past Medical History Patient  has a past medical history of Anxiety (05/02/2017), Arthralgia (05/02/2017), Brown recluse spider bite, Positive ANA (antinuclear antibody) (05/02/2017), and Urticaria.  Surgical History Patient  has no past surgical history on file.  Family History Pateint's family history includes Hypercalcemia in her mother; Hypothyroidism in her maternal grandmother; Thyroid disease in her mother.  Social History Patient  reports that she quit smoking about 8 years ago. Her smoking use included cigarettes. She has a 5.00 pack-year smoking history. She has never used smokeless tobacco. She reports current alcohol use. She reports that she does not use drugs.    Objective: Vitals:   04/04/19 1334  BP: 104/80  Pulse: 72  Temp: 98.2 F (36.8 C)  SpO2: 97%  Weight: 177 lb 3.2 oz (80.4 kg)  Height: 5\' 4"  (1.626 m)    Body mass index is 30.42 kg/m.  Physical Exam Vitals reviewed.  Constitutional:      Appearance: Normal appearance. She is well-developed. She is obese.  HENT:     Head: Normocephalic and atraumatic.     Right Ear: Tympanic membrane, ear canal and external ear normal.     Left Ear: Tympanic membrane, ear canal and external ear normal.     Nose: Nose normal.     Mouth/Throat:     Mouth: Mucous membranes are moist.  Eyes:     Extraocular Movements: Extraocular movements intact.     Conjunctiva/sclera: Conjunctivae normal.     Pupils: Pupils are equal, round, and reactive to light.  Neck:     Thyroid: No thyromegaly.  Cardiovascular:     Rate and Rhythm: Normal rate and regular rhythm.     Pulses:  Normal pulses.     Heart sounds: Normal heart sounds. No murmur.  Pulmonary:     Effort: Pulmonary effort is normal.     Breath sounds: Normal breath sounds.  Abdominal:     General: Abdomen is flat. Bowel sounds are normal. There is no distension.     Palpations: Abdomen is soft.     Tenderness: There  is no abdominal tenderness.  Musculoskeletal:     Cervical back: Normal range of motion and neck supple.  Lymphadenopathy:     Cervical: No cervical adenopathy.  Skin:    General: Skin is warm and dry.     Capillary Refill: Capillary refill takes less than 2 seconds.     Findings: No rash.  Neurological:     General: No focal deficit present.     Mental Status: She is alert and oriented to person, place, and time.     Cranial Nerves: No cranial nerve deficit.     Coordination: Coordination normal.     Deep Tendon Reflexes: Reflexes normal.  Psychiatric:        Mood and Affect: Mood normal.        Behavior: Behavior normal.      Office Visit from 04/04/2019 in [facility]  PHQ-2 Total Score  0         Assessment/plan: 1. Annual physical exam Chart reviewed. Problem list, allergies, medications and HM. UTD on HM. Getting covid shot on Sunday. Routine labs today, she is not fasting. Continue exercise, healthy diet. F/u in one year or as needed.  Patient counseling [x]    Nutrition: Stressed importance of moderation in sodium/caffeine intake, saturated fat and cholesterol, caloric balance, sufficient intake of fresh fruits, vegetables, fiber, calcium, iron, and 1 mg of folate supplement per day (for females capable of pregnancy).  [x]    Stressed the importance of regular exercise.   []    Substance Abuse: Discussed cessation/primary prevention of tobacco, alcohol, or other drug use; driving or other dangerous activities under the influence; availability of treatment for abuse.   [x]    Injury prevention: Discussed safety belts, safety helmets, smoke detector, smoking near bedding or upholstery.   [x]    Sexuality: Discussed sexually transmitted diseases, partner selection, use of condoms, avoidance of unintended pregnancy  and contraceptive alternatives.  [x]    Dental health: Discussed importance of regular tooth brushing, flossing, and dental visits.  [x]    Health  maintenance and immunizations reviewed. Please refer to Health maintenance section.    - CBC with Differential/Platelet - Comprehensive metabolic panel - Lipid panel - TSH - VITAMIN D 25 Hydroxy (Vit-D Deficiency, Fractures)    This visit occurred during the SARS-CoV-2 public health emergency.  Safety protocols were in place, including screening questions prior to the visit, additional usage of staff PPE, and extensive cleaning of exam room while observing appropriate contact time as indicated for disinfecting solutions.     Return in about 1 year (around 04/03/2020).     Orma Flaming, MD [place]  04/04/2019

## 2019-04-06 ENCOUNTER — Ambulatory Visit: Payer: 59 | Attending: Internal Medicine

## 2019-04-06 DIAGNOSIS — Z23 Encounter for immunization: Secondary | ICD-10-CM

## 2019-04-06 NOTE — Progress Notes (Signed)
   Covid-19 Vaccination Clinic  Name:  Shannon Gordon    MRN: [id number] DOB: [birth year]  04/06/2019  Shannon Gordon was observed post Covid-19 immunization for 15 minutes without incident. She was provided with Vaccine Information Sheet and instruction to access the V-Safe system.   Shannon Gordon was instructed to call 911 with any severe reactions post vaccine: Marland Kitchen Difficulty breathing  . Swelling of face and throat  . A fast heartbeat  . A bad rash all over body  . Dizziness and weakness   Immunizations Administered    Name Date Dose VIS Date Route   Pfizer COVID-19 Vaccine 04/06/2019  4:04 PM 0.3 mL 01/10/2019 Intramuscular   Manufacturer: [place]   Lot: [id number]   [place]: [id number]

## 2019-04-09 ENCOUNTER — Encounter: Payer: Self-pay | Admitting: Family Medicine

## 2019-04-11 ENCOUNTER — Other Ambulatory Visit: Payer: Self-pay | Admitting: Family Medicine

## 2019-04-11 DIAGNOSIS — K219 Gastro-esophageal reflux disease without esophagitis: Secondary | ICD-10-CM

## 2019-04-11 MED ORDER — PANTOPRAZOLE SODIUM 40 MG PO TBEC: 40.0000 mg | DELAYED_RELEASE_TABLET | Freq: Every day | ORAL | 3 refills | Status: DC

## 2019-04-15 ENCOUNTER — Other Ambulatory Visit: Payer: Self-pay | Admitting: Family Medicine

## 2019-04-15 DIAGNOSIS — J309 Allergic rhinitis, unspecified: Secondary | ICD-10-CM

## 2019-04-18 ENCOUNTER — Encounter: Payer: Self-pay | Admitting: Gastroenterology

## 2019-04-18 ENCOUNTER — Other Ambulatory Visit: Payer: Self-pay | Admitting: Family Medicine

## 2019-04-18 DIAGNOSIS — K219 Gastro-esophageal reflux disease without esophagitis: Secondary | ICD-10-CM

## 2019-04-28 ENCOUNTER — Encounter: Payer: Self-pay | Admitting: Family Medicine

## 2019-04-28 DIAGNOSIS — R110 Nausea: Secondary | ICD-10-CM

## 2019-04-28 DIAGNOSIS — J101 Influenza due to other identified influenza virus with other respiratory manifestations: Secondary | ICD-10-CM

## 2019-04-28 MED ORDER — ONDANSETRON HCL 4 MG PO TABS: 4.0000 mg | ORAL_TABLET | Freq: Three times a day (TID) | ORAL | 0 refills | Status: DC | PRN

## 2019-05-06 ENCOUNTER — Encounter: Payer: Self-pay | Admitting: Family Medicine

## 2019-05-06 ENCOUNTER — Ambulatory Visit: Payer: 59 | Attending: Internal Medicine

## 2019-05-06 DIAGNOSIS — Z23 Encounter for immunization: Secondary | ICD-10-CM

## 2019-05-06 NOTE — Progress Notes (Signed)
   Covid-19 Vaccination Clinic  Name:  Shannon Gordon    MRN: [id number] DOB: 11-18-1980  05/06/2019  [name] was observed post Covid-19 immunization for 15 minutes without incident. She was provided with Vaccine Information Sheet and instruction to access the V-Safe system.   [name] was instructed to call 911 with any severe reactions post vaccine: Marland Kitchen Difficulty breathing  . Swelling of face and throat  . A fast heartbeat  . A bad rash all over body  . Dizziness and weakness   Immunizations Administered    Name Date Dose VIS Date Route   Pfizer COVID-19 Vaccine 05/06/2019  1:50 PM 0.3 mL 01/10/2019 Intramuscular   Manufacturer: [place]   Lot: [id number]   [place]: [id number]

## 2019-05-07 ENCOUNTER — Other Ambulatory Visit: Payer: Self-pay

## 2019-05-07 DIAGNOSIS — R110 Nausea: Secondary | ICD-10-CM

## 2019-05-07 DIAGNOSIS — J101 Influenza due to other identified influenza virus with other respiratory manifestations: Secondary | ICD-10-CM

## 2019-05-07 MED ORDER — ONDANSETRON HCL 4 MG PO TABS: 4.0000 mg | ORAL_TABLET | Freq: Three times a day (TID) | ORAL | 0 refills | Status: DC | PRN

## 2019-05-27 ENCOUNTER — Other Ambulatory Visit: Payer: Self-pay

## 2019-05-27 ENCOUNTER — Encounter: Payer: Self-pay | Admitting: Gastroenterology

## 2019-05-27 ENCOUNTER — Ambulatory Visit (INDEPENDENT_AMBULATORY_CARE_PROVIDER_SITE_OTHER): Payer: 59 | Admitting: Gastroenterology

## 2019-05-27 VITALS — BP 104/70 | HR 72 | Temp 97.8°F | Ht 64.0 in | Wt 173.0 lb

## 2019-05-27 DIAGNOSIS — R110 Nausea: Secondary | ICD-10-CM | POA: Diagnosis not present

## 2019-05-27 DIAGNOSIS — K219 Gastro-esophageal reflux disease without esophagitis: Secondary | ICD-10-CM | POA: Diagnosis not present

## 2019-05-27 MED ORDER — FAMOTIDINE 20 MG PO TABS
20.0000 mg | ORAL_TABLET | Freq: Two times a day (BID) | ORAL | 1 refills | Status: AC
Start: 2019-05-27 — End: ?

## 2019-05-27 MED ORDER — ONDANSETRON HCL 4 MG PO TABS: 4.0000 mg | ORAL_TABLET | Freq: Three times a day (TID) | ORAL | 0 refills | Status: DC | PRN

## 2019-05-27 NOTE — Patient Instructions (Addendum)
Continue to take your pantoprazole 40 mg twice daily.  Add famotidine 20 mg twice daily.  We have sent the following medications to your pharmacy for you to pick up at your convenience: Famotidine Zofran  I have recommended an EGD with esophageal biopsies to further understand why you are not improving despite being on full dose acid blocking treatment.  Modifying diet and lifestyle remains the foundation for treating the symptoms of reflux.   The following strategies help you prevent heartburn and other symptoms by avoiding foods that reduce the effectiveness of the bottom of the esophagus from protecting the esophagus from from acid injury and keeping stomach contents where they belong.  Eat smaller meals. A large meal remains in the stomach for several hours, increasing the chances for gastroesophageal reflux. Try distributing your daily food intake over three, four, or five smaller meals.  Relax when you eat. Stress increases the production of stomach acid, so make meals a pleasant, relaxing experience. Sit down. Eat slowly. Chew completely. Play soothing music.  Relax between meals. Relaxation therapies such as deep breathing, meditation, massage, tai chi, or yoga may help prevent and relieve heartburn.   Remain upright after eating. You should maintain postures that reduce the risk for reflux for at least three hours after eating. For example, don't bend over or strain to lift heavy objects.  Avoid eating within three hours of going to bed. Lying down after eating will increase chances of reflux.  Lose weight. Excess pounds increase pressure on the stomach and can push acid into the esophagus.  Loosen up. Avoid tight belts, waistbands, and other clothing that puts pressure on your stomach.  Avoid foods that burn. Abstain from food or drink that increases gastric acid secretion, decreases the valve at the bottom of the esophagus, or slows the emptying of the stomach. Known offenders  include high-fat foods, spicy dishes, tomatoes and tomato products, citrus fruits, garlic, onions, milk, carbonated drinks, coffee (including decaf), tea, chocolate, mints, and alcohol.  Stop smoking. Nicotine stimulates stomach acid.  Avoid medications that can predispose you to reflux including aspirin and other NSAIDs, oral contraceptives, hormone therapy drugs, and certain antidepressants.  Sleep at an angle. If you're bothered by nighttime heartburn, place a wedge (available in medical supply stores or a wedge pillow through [employer]) under your upper body. But don't elevate your head with extra pillows. That makes reflux worse by bending you at the waist and compressing your stomach. You might also try sleeping on your left side, as studies have shown this can help-perhaps because the stomach is on the left side of the body, so lying on your left positions most of the stomach below the bottom of the esophagus.  You have been scheduled for an endoscopy. Please follow written instructions given to you at your visit today. If you use inhalers (even only as needed), please bring them with you on the day of your procedure. Your physician has requested that you go to www.startemmi.com and enter the access code given to you at your visit today. This web site gives a general overview about your procedure. However, you should still follow specific instructions given to you by our office regarding your preparation for the procedure.  Thank you for trusting me with your gastrointestinal care!    Thornton Park, MD, MPH  If you are age 56 or older, your body mass index should be between 23-30. Your Body mass index is 29.7 kg/m. If this is out of the aforementioned  range listed, please consider follow up with your Primary Care Provider.  If you are age 39 or younger, your body mass index should be between 19-25. Your Body mass index is 29.7 kg/m. If this is out of the aformentioned range listed,  please consider follow up with your Primary Care Provider.

## 2019-05-27 NOTE — Progress Notes (Signed)
Referring Provider: Orma Flaming, MD Primary Care Physician:  Orma Flaming, MD  Reason for Consultation: GERD   IMPRESSION:  Reflux not responding to PPI BID Frequent nausea Seasonal allergies Allergic rhinitis NSAID use for knee pain Family history of colon polyps (mother in her 80s; maternal grandmother with colon cancer in her 74s)  Reflux not responding to PPI BID without alarm features: I have recommended an EGD with esophageal and gastric biopsies given the differential of reflux esophagitis, eosinophilic esophagitis in the setting of allergies, H pylori, PUD, gastritis, and non-erosive reflux disease. Continue PPI BID. Add H2B BID. Reviewed dietary and lifestyle modifications.  Recommending that she avoid all NSAIDs.   Nausea: May be related to reflux. EGD planned for further evaluation.   Given her family history, colonoscopy recommended starting at age 68.    PLAN: Reviewed lifestyle modifications Continue pantoprazole 40 mg twice daily Add famotidine 20 mg BID May refill Zofran at patient request (initially prescribed by [name]) Avoid all NSAIDs EGD with esophageal biopsies Colonoscopy for colon cancer screening started 05/2020  Will proceed with endoscopy. The nature of the procedure, as well as the risks, benefits, and alternatives were carefully and thoroughly reviewed with the patient. Ample time for discussion and questions allowed. The patient understood, was satisfied, and agreed to proceed.  Please see the "Patient Instructions" section for addition details about the plan.  HPI: Shannon Gordon is a 39 y.o. female for by Dr. Verl Blalock for further evaluation of reflux not responding to daily PPI therapy.  History is obtained through the patient and review of her electronic health record.  She has seasonal allergies, allergic rhinitis. She is an Office manager.   Initially started Nexium in 2015 for reflux that developed while pregnant.  Took treatment  daily for approximately 2 years.  Has only needed intermittent therapy since that time until months ago.  2 months ago she developed nearly daily symptoms. She has reflux, brash and a dry throat. Frequently associated with nausea. Rare dysphonia. No cough. No nocturnal symptoms.   No evidence for GI bleeding, iron deficiency anemia, anorexia, unexplained weight loss, dysphagia, odynophagia, persistent vomiting, or gastrointestinal cancer in a first-degree relative.  Not responding to Nexium 40 mg daily for 10 days.  Removed carbonated beverages, coffee, spicy foods, dairy, and greasy foods from her diet. No association of symptoms with carbs. Identifies herself as a picky eater. Does not eat before bed. Sleeps with the head of her bed elevated.  Previously used Administrator for breakthrough symptoms.  Treated with Protonix twice daily over the last month without change in symptoms.  Uses Aleve for knee pain a couple times every week.   Mother with colon polyps in her 8s or early 40s. Maternal grandmother and maternal great aunt had colon cancer in their 48s. No known family history of colon cancer or polyps. No family history of uterine/endometrial cancer, pancreatic cancer or gastric/stomach cancer.   Past Medical History:  Diagnosis Date  . Anxiety 05/02/2017   We reviewed hydroxyzine as an option for her anxiety, especially since it is manifested as itching and hives as well.  . Arthralgia 05/02/2017   Patient has been followed by orthopedics in the past.  She does take Mobic and as needed tramadol.  I am okay with taking his medications over.  Owens Shark recluse spider bite   . Positive ANA (antinuclear antibody) 05/02/2017   Patient already follows with rheumatology.  . Urticaria     No past surgical  history on file.  Current Outpatient Medications  Medication Sig Dispense Refill  . cetirizine (ZYRTEC) 10 MG tablet Take 1 tablet (10 mg total) by mouth 2 (two) times daily. 30 tablet 5  .  fluticasone (FLONASE) 50 MCG/ACT nasal spray Place into both nostrils daily.    . montelukast (SINGULAIR) 10 MG tablet Take 1 tablet (10 mg total) by mouth at bedtime. 30 tablet 3  . ondansetron (ZOFRAN) 4 MG tablet Take 1 tablet (4 mg total) by mouth every 8 (eight) hours as needed for nausea or vomiting. 30 tablet 0  . pantoprazole (PROTONIX) 40 MG tablet Take 1 tablet (40 mg total) by mouth daily. 30 tablet 3  . triamcinolone cream (KENALOG) 0.1 %      No current facility-administered medications for this visit.    Allergies as of 05/27/2019  . (No Known Allergies)    Family History  Problem Relation Age of Onset  . Thyroid disease Mother   . Hypercalcemia Mother   . Hypothyroidism Maternal Grandmother     Social History   Socioeconomic History  . Marital status: Married    Spouse name: Not on file  . Number of children: Not on file  . Years of education: 68  . Highest education level: Bachelor's degree (e.g., BA, AB, BS)  Occupational History  . Not on file  Tobacco Use  . Smoking status: Former Smoker    Packs/day: 0.50    Years: 10.00    Pack years: 5.00    Types: Cigarettes    Quit date: 2013    Years since quitting: 8.3  . Smokeless tobacco: Never Used  Substance and Sexual Activity  . Alcohol use: Yes    Comment: occ use only   . Drug use: Never  . Sexual activity: Not on file  Other Topics Concern  . Not on file  Social History Narrative   Married   1 5/yo child (born in 2015)   Works for a preschool 2 days a week   Social Determinants of Radio broadcast assistant Strain:   . Difficulty of Paying Living Expenses:   Food Insecurity:   . Worried About Charity fundraiser in the Last Year:   . Arboriculturist in the Last Year:   Transportation Needs:   . Film/video editor (Medical):   Marland Kitchen Lack of Transportation (Non-Medical):   Physical Activity:   . Days of Exercise per Week:   . Minutes of Exercise per Session:   Stress:   . Feeling of  Stress :   Social Connections:   . Frequency of Communication with Friends and Family:   . Frequency of Social Gatherings with Friends and Family:   . Attends Religious Services:   . Active Member of Clubs or Organizations:   . Attends Archivist Meetings:   Marland Kitchen Marital Status:   Intimate Partner Violence:   . Fear of Current or Ex-Partner:   . Emotionally Abused:   Marland Kitchen Physically Abused:   . Sexually Abused:     Review of Systems: 12 system ROS is negative except as noted above with the addition of allergies, multiple sinus infections, back pain, fatigue, sore throat, polydipsia.   Physical Exam: General:   Alert,  well-nourished, pleasant and cooperative in NAD Head:  Normocephalic and atraumatic. Eyes:  Sclera clear, no icterus.   Conjunctiva pink. Ears:  Normal auditory acuity. Nose:  No deformity, discharge,  or lesions. Mouth:  No deformity or lesions.  Neck:  Supple; no masses or thyromegaly. Abdomen:  Soft, nontender, nondistended, normal bowel sounds, no rebound or guarding. No hepatosplenomegaly.   Rectal:  Deferred  Msk:  Symmetrical. No boney deformities LAD: No inguinal or umbilical LAD Extremities:  No clubbing or edema. Neurologic:  Alert and  oriented x4;  grossly nonfocal Skin:  Intact without significant lesions or rashes. Psych:  Alert and cooperative. Normal mood and affect.   Mayford Alberg L. Tarri Glenn, MD, MPH 05/27/2019, 2:28 PM

## 2019-06-11 ENCOUNTER — Encounter: Payer: Self-pay | Admitting: Gastroenterology

## 2019-06-17 ENCOUNTER — Other Ambulatory Visit: Payer: Self-pay | Admitting: Family Medicine

## 2019-06-20 ENCOUNTER — Encounter: Payer: Self-pay | Admitting: Gastroenterology

## 2019-06-20 ENCOUNTER — Ambulatory Visit (AMBULATORY_SURGERY_CENTER): Payer: 59 | Admitting: Gastroenterology

## 2019-06-20 ENCOUNTER — Other Ambulatory Visit: Payer: Self-pay

## 2019-06-20 VITALS — BP 102/71 | HR 74 | Temp 97.3°F | Resp 18 | Ht 64.0 in | Wt 173.0 lb

## 2019-06-20 DIAGNOSIS — K209 Esophagitis, unspecified without bleeding: Secondary | ICD-10-CM

## 2019-06-20 DIAGNOSIS — K2950 Unspecified chronic gastritis without bleeding: Secondary | ICD-10-CM

## 2019-06-20 DIAGNOSIS — K219 Gastro-esophageal reflux disease without esophagitis: Secondary | ICD-10-CM | POA: Diagnosis not present

## 2019-06-20 DIAGNOSIS — K3189 Other diseases of stomach and duodenum: Secondary | ICD-10-CM | POA: Diagnosis not present

## 2019-06-20 MED ORDER — SODIUM CHLORIDE 0.9 % IV SOLN: 500.0000 mL | Freq: Once | INTRAVENOUS | Status: DC

## 2019-06-20 NOTE — Progress Notes (Signed)
Report given to PACU, vss 

## 2019-06-20 NOTE — Op Note (Signed)
[place] Patient Name: Shannon Gordon Procedure Date: 06/20/2019 3:25 PM MRN: [id number] Endoscopist: Thornton Park MD, MD Age: 39 Referring MD:  Date of Birth: Feb 01, 1980 Gender: Female Account #: [id number] Procedure:                Upper GI endoscopy Indications:              Esophageal reflux symptoms that persist despite                            appropriate therapy, Nausea Medicines:                Monitored Anesthesia Care Procedure:                Pre-Anesthesia Assessment:                           - Prior to the procedure, a History and Physical                            was performed, and patient medications and                            allergies were reviewed. The patient's tolerance of                            previous anesthesia was also reviewed. The risks                            and benefits of the procedure and the sedation                            options and risks were discussed with the patient.                            All questions were answered, and informed consent                            was obtained. Prior Anticoagulants: The patient has                            taken no previous anticoagulant or antiplatelet                            agents. ASA Grade Assessment: II - A patient with                            mild systemic disease. After reviewing the risks                            and benefits, the patient was deemed in                            satisfactory condition to undergo the procedure.  After obtaining informed consent, the endoscope was                            passed under direct vision. Throughout the                            procedure, the patient's blood pressure, pulse, and                            oxygen saturations were monitored continuously. The                            Endoscope was introduced through the mouth, and                            advanced to the third  part of duodenum. The upper                            GI endoscopy was accomplished without difficulty.                            The patient tolerated the procedure well. Scope In: Scope Out: Findings:                 LA Grade A (one or more mucosal breaks less than 5                            mm, not extending between tops of 2 mucosal folds)                            esophagitis with no bleeding was found. Biopsies                            were taken from the mid/proximal and distal                            esophagus with a cold forceps for histology.                            Estimated blood loss was minimal.                           The entire examined stomach was normal except for                            mild erythema in the body. Biopsies were taken from                            the antrum, body, and fundus with a cold forceps                            for histology. Biopsies were taken with a cold  forceps for histology. Estimated blood loss was                            minimal.                           The examined duodenum was normal.                           The cardia and gastric fundus were normal on                            retroflexion.                           The exam was otherwise without abnormality. Complications:            No immediate complications. Estimated blood loss:                            Minimal. Estimated Blood Loss:     Estimated blood loss was minimal. Impression:               - LA Grade A reflux esophagitis with no bleeding.                            Biopsied.                           - Mild gastropathy. Biopsied.                           - Normal examined duodenum.                           - The examination was otherwise normal. Recommendation:           - Patient has a contact number available for                            emergencies. The signs and symptoms of potential                             delayed complications were discussed with the                            patient. Return to normal activities tomorrow.                            Written discharge instructions were provided to the                            patient.                           - Resume previous diet.                           -  Continue present medications including                            pantoprazole 40 mg BID and famotidine 20 mg BID.                           - Avoid all NSAIDs.                           - Await pathology results.                           - Follow-up in the office in 2-4 weeks to review                            these results and monitor the response to treatment. Thornton Park MD, MD 06/20/2019 3:55:42 PM This report has been signed electronically.

## 2019-06-20 NOTE — Progress Notes (Signed)
Called to room to assist during endoscopic procedure.  Patient ID and intended procedure confirmed with present staff. Received instructions for my participation in the procedure from the performing physician.  

## 2019-06-20 NOTE — Patient Instructions (Signed)
HANDOUTS PROVIDED ON: ESOPHAGITIS  The biopsies taken today have been sent for pathology.  The results can take 1-3 weeks to receive.    You may resume your previous diet and medication schedule. AVOID ALL NSAIDs.  Thank you for allowing [place] to care for you today!!!   YOU HAD AN ENDOSCOPIC PROCEDURE TODAY AT [place]:   Refer to the procedure report that was given to you for any specific questions about what was found during the examination.  If the procedure report does not answer your questions, please call your gastroenterologist to clarify.  If you requested that your care partner not be given the details of your procedure findings, then the procedure report has been included in a sealed envelope for you to review at your convenience later.  YOU SHOULD EXPECT: Some feelings of bloating in the abdomen. Passage of more gas than usual.  Walking can help get rid of the air that was put into your GI tract during the procedure and reduce the bloating.  Please Note:  You might notice some irritation and congestion in your nose or some drainage.  This is from the oxygen used during your procedure.  There is no need for concern and it should clear up in a day or so.  SYMPTOMS TO REPORT IMMEDIATELY:   Following upper endoscopy (EGD)  Vomiting of blood or coffee ground material  New chest pain or pain under the shoulder blades  Painful or persistently difficult swallowing  New shortness of breath  Fever of 100F or higher  Black, tarry-looking stools  For urgent or emergent issues, a gastroenterologist can be reached at any hour by calling [phone]. Do not use MyChart messaging for urgent concerns.    DIET:  We do recommend a small meal at first, but then you may proceed to your regular diet.  Drink plenty of fluids but you should avoid alcoholic beverages for 24 hours.  ACTIVITY:  You should plan to take it easy for the rest of today and you should NOT DRIVE or use  heavy machinery until tomorrow (because of the sedation medicines used during the test).    FOLLOW UP: Our staff will call the number listed on your records 48-72 hours following your procedure to check on you and address any questions or concerns that you may have regarding the information given to you following your procedure. If we do not reach you, we will leave a message.  We will attempt to reach you two times.  During this call, we will ask if you have developed any symptoms of COVID 19. If you develop any symptoms (ie: fever, flu-like symptoms, shortness of breath, cough etc.) before then, please call [phone].  If you test positive for Covid 19 in the 2 weeks post procedure, please call and report this information to [place].    If any biopsies were taken you will be contacted by phone or by letter within the next 1-3 weeks.  Please call us at [phone] if you have not heard about the biopsies in 3 weeks.    SIGNATURES/CONFIDENTIALITY: You and/or your care partner have signed paperwork which will be entered into your electronic medical record.  These signatures attest to the fact that that the information above on your After Visit Summary has been reviewed and is understood.  Full responsibility of the confidentiality of this discharge information lies with you and/or your care-partner.

## 2019-06-20 NOTE — Progress Notes (Signed)
AR - check in DT - VS

## 2019-06-23 ENCOUNTER — Other Ambulatory Visit: Payer: Self-pay

## 2019-06-23 DIAGNOSIS — K219 Gastro-esophageal reflux disease without esophagitis: Secondary | ICD-10-CM

## 2019-06-23 MED ORDER — PANTOPRAZOLE SODIUM 40 MG PO TBEC: 40.0000 mg | DELAYED_RELEASE_TABLET | Freq: Two times a day (BID) | ORAL | 5 refills | Status: DC

## 2019-06-24 ENCOUNTER — Telehealth: Payer: Self-pay

## 2019-06-24 NOTE — Telephone Encounter (Signed)
  Follow up Call-  Call back number 06/20/2019  Post procedure Call Back phone  # [phone] cell  Permission to leave phone message Yes  Some recent data might be hidden     Left Message

## 2019-06-24 NOTE — Telephone Encounter (Signed)
Left message on answering machine. 

## 2019-06-25 ENCOUNTER — Encounter: Payer: Self-pay | Admitting: Gastroenterology

## 2019-06-27 ENCOUNTER — Encounter: Payer: Self-pay | Admitting: Family Medicine

## 2019-06-27 ENCOUNTER — Other Ambulatory Visit: Payer: Self-pay

## 2019-06-27 ENCOUNTER — Ambulatory Visit (INDEPENDENT_AMBULATORY_CARE_PROVIDER_SITE_OTHER): Payer: 59 | Admitting: Family Medicine

## 2019-06-27 VITALS — BP 108/58 | HR 72 | Temp 98.0°F | Ht 64.0 in | Wt 174.0 lb

## 2019-06-27 DIAGNOSIS — J0100 Acute maxillary sinusitis, unspecified: Secondary | ICD-10-CM

## 2019-06-27 DIAGNOSIS — M51369 Other intervertebral disc degeneration, lumbar region without mention of lumbar back pain or lower extremity pain: Secondary | ICD-10-CM | POA: Insufficient documentation

## 2019-06-27 DIAGNOSIS — M5030 Other cervical disc degeneration, unspecified cervical region: Secondary | ICD-10-CM | POA: Insufficient documentation

## 2019-06-27 MED ORDER — PREDNISONE 20 MG PO TABS
40.0000 mg | ORAL_TABLET | Freq: Every day | ORAL | 0 refills | Status: DC
Start: 2019-06-27 — End: 2019-08-07

## 2019-06-27 NOTE — Patient Instructions (Signed)
1) will do steroid burst on you and see if this helps. 2 pills daily x 5 days.  2) start flonase daily and zyrtec daily.   Let me know if you are not getting better!  Happy weekend!  [name]

## 2019-06-27 NOTE — Progress Notes (Signed)
Patient: Shannon Gordon MRN: [id number] DOB: 19-Aug-1980 PCP: Orma Flaming, MD     Subjective:  Chief Complaint  Patient presents with  . Pressure Behind the Eyes    Noticing 2 weeks ago.  . Neck Pain    HPI: The patient is a 39 y.o. female who presents today for pain under both eyes, and pressure and pain under both ears. Starting 2 weeks ago. She also has some pain and burning under her left mandibular angle. She just replaced her HVAC due to mold. She has had sinus issues and feels the pressure in her sinuses, but she doesn't have congestion. No fever/chills. No coughing or shortness of breath. No ear pain. No pain with chewing or opening or closing her mouth. She has taken zyrtec and flonase.     Review of Systems  Constitutional: Negative for chills and fever.  HENT: Positive for sinus pressure and sinus pain. Negative for congestion.   Respiratory: Negative for cough, shortness of breath and wheezing.   Cardiovascular: Negative for chest pain and palpitations.  Neurological: Negative for dizziness, light-headedness and headaches.    Allergies Patient has No Known Allergies.  Past Medical History Patient  has a past medical history of Allergy, Anxiety (05/02/2017), Arthralgia (05/02/2017), Owens Shark recluse spider bite, GERD (gastroesophageal reflux disease), Positive ANA (antinuclear antibody) (05/02/2017), and Urticaria.  Surgical History Patient  has a past surgical history that includes Leg Surgery (Right).  Family History Pateint's family history includes Colon cancer in her maternal grandmother and another family member; Colon polyps in her mother; Hypercalcemia in her mother; Hypothyroidism in her maternal grandmother; Thyroid disease in her mother.  Social History Patient  reports that she quit smoking about 8 years ago. Her smoking use included cigarettes. She has a 5.00 pack-year smoking history. She has never used smokeless tobacco. She reports current alcohol use. She  reports that she does not use drugs.    Objective: Vitals:   06/27/19 1043  BP: (!) 108/58  Pulse: 72  Temp: 98 F (36.7 C)  TempSrc: Temporal  SpO2: 98%  Weight: 174 lb (78.9 kg)  Height: 5\' 4"  (1.626 m)    Body mass index is 29.87 kg/m.  Physical Exam Constitutional:      Appearance: Normal appearance. She is obese.  HENT:     Head: Normocephalic and atraumatic.     Right Ear: Tympanic membrane, ear canal and external ear normal.     Left Ear: Tympanic membrane, ear canal and external ear normal.     Mouth/Throat:     Comments: Cobblestoning on posterior pharynx  Eyes:     Extraocular Movements: Extraocular movements intact.     Pupils: Pupils are equal, round, and reactive to light.  Cardiovascular:     Rate and Rhythm: Normal rate and regular rhythm.     Heart sounds: Normal heart sounds.  Pulmonary:     Effort: Pulmonary effort is normal.     Breath sounds: Normal breath sounds.  Abdominal:     General: Bowel sounds are normal.     Palpations: Abdomen is soft.  Musculoskeletal:     Cervical back: Normal range of motion and neck supple.  Lymphadenopathy:     Cervical: No cervical adenopathy.  Skin:    Capillary Refill: Capillary refill takes less than 2 seconds.  Neurological:     General: No focal deficit present.     Mental Status: She is alert and oriented to person, place, and time.  Assessment/plan: 1. Subacute maxillary sinusitis No signs of infection at this point. Pressure and pain is her main issue. We are going to do a short burst of steroids to see if this helps and I wonder if pain under ears is due to congestion in sinuses/ drainage. Only has this pain when she feels congested or has sinus pressure/pain. Has had this worked up with ent with CT with no findings in past. I also want her to use her Flonase daily. Let me know if not getting better.      This visit occurred during the SARS-CoV-2 public health emergency.  Safety protocols  were in place, including screening questions prior to the visit, additional usage of staff PPE, and extensive cleaning of exam room while observing appropriate contact time as indicated for disinfecting solutions.    Return if symptoms worsen or fail to improve.    Orma Flaming, MD [place]   06/27/2019

## 2019-07-02 ENCOUNTER — Telehealth: Payer: Self-pay | Admitting: Family Medicine

## 2019-07-02 NOTE — Telephone Encounter (Signed)
Called patient, and had her speak to team health for low BP and dizziness.

## 2019-07-02 NOTE — Telephone Encounter (Signed)
Nurse Assessment Nurse: Zorita Pang, RN, Deborah Date/Time (Eastern Time): 07/02/2019 9:49:16 AM Confirm and document reason for call. If symptomatic, describe symptoms. ---The caller states that she has changed her diet due to reflux. She states that her BP has been lower than normal. Normally 110s to 170s and 100/60. Then she has had 95/50 and 105/50. She bought a BP cuff recently and her blood pressure 105/69. She has had postural episodes of dizziness. Has the patient had close contact with a person known or suspected to have the novel coronavirus illness OR traveled / lives in area with major community spread (including international travel) in the last 14 days from the onset of symptoms? * If Asymptomatic, screen for exposure and travel within the last 14 days. ---No Does the patient have any new or worsening symptoms? ---Yes Will a triage be completed? ---Yes Related visit to physician within the last 2 weeks? ---No Does the PT have any chronic conditions? (i.e. diabetes, asthma, this includes High risk factors for pregnancy, etc.) ---Yes List chronic conditions. ---reflux, sinusitis chronic, knee and back pain Is the patient pregnant or possibly pregnant? (Ask all females between the ages of 23-55) ---No Is this a behavioral health or substance abuse call? ---NoPLEASE NOTE: All timestamps contained within this report are represented as [place] Standard Time. CONFIDENTIALTY NOTICE: This fax transmission is intended only for the addressee. It contains information that is legally privileged, confidential or otherwise protected from use or disclosure. If you are not the intended recipient, you are strictly prohibited from reviewing, disclosing, copying using or disseminating any of this information or taking any action in reliance on or regarding this information. If you have received this fax in error, please notify us immediately by telephone so that we can arrange for its return to  [place]. Phone: [phone], Toll-Free: [phone], Fax: [phone] Page: 2 of 2 Call Id: [id number] Guidelines Guideline Title Affirmed Question Affirmed Notes Nurse Date/Time Eilene Ghazi Time) Blood Pressure - Low Brief (now gone) dizziness or lightheadedness after standing up or eating Womble, RN, Neoma Laming 07/02/2019 9:55:07 AM Disp. Time Eilene Ghazi Time) Disposition Final User 07/02/2019 10:00:13 AM SEE PCP WITHIN 3 DAYS Yes Womble, RN, Solicitor Disagree/Comply Comply Caller Understands Yes PreDisposition Call Doctor Care Advice Given Per Guideline SEE PCP WITHIN 3 DAYS: * Lightheadedness or dizziness worsens * Systolic BP under 90

## 2019-07-02 NOTE — Telephone Encounter (Signed)
Please make app for pt.  Thank You

## 2019-07-03 ENCOUNTER — Other Ambulatory Visit: Payer: Self-pay

## 2019-07-03 ENCOUNTER — Encounter: Payer: Self-pay | Admitting: Family Medicine

## 2019-07-03 ENCOUNTER — Ambulatory Visit (INDEPENDENT_AMBULATORY_CARE_PROVIDER_SITE_OTHER): Payer: 59 | Admitting: Family Medicine

## 2019-07-03 VITALS — BP 100/72 | HR 66 | Temp 97.8°F | Ht 64.0 in | Wt 173.4 lb

## 2019-07-03 DIAGNOSIS — M5137 Other intervertebral disc degeneration, lumbosacral region: Secondary | ICD-10-CM | POA: Insufficient documentation

## 2019-07-03 DIAGNOSIS — M51379 Other intervertebral disc degeneration, lumbosacral region without mention of lumbar back pain or lower extremity pain: Secondary | ICD-10-CM | POA: Insufficient documentation

## 2019-07-03 DIAGNOSIS — R42 Dizziness and giddiness: Secondary | ICD-10-CM

## 2019-07-03 DIAGNOSIS — M5030 Other cervical disc degeneration, unspecified cervical region: Secondary | ICD-10-CM | POA: Insufficient documentation

## 2019-07-03 MED ORDER — BACLOFEN 20 MG PO TABS
20.0000 mg | ORAL_TABLET | Freq: Two times a day (BID) | ORAL | 0 refills | Status: DC
Start: 2019-07-03 — End: 2020-02-13

## 2019-07-03 MED ORDER — MECLIZINE HCL 25 MG PO TABS: 25.0000 mg | ORAL_TABLET | Freq: Three times a day (TID) | ORAL | 0 refills | Status: AC | PRN

## 2019-07-03 NOTE — Progress Notes (Signed)
Patient: Shannon Gordon MRN: [id number] DOB: [birth year] PCP: Orma Flaming, MD     Subjective:  Chief Complaint  Patient presents with  . Dizziness    chronic  . Hypotension    HPI: The patient is a 39 y.o. female who presents today for dizziness. Se says that she has had dizziness for years. She has had this worked up in the past by ENT and states nothing was found. Recent MRI in 12/2018 was negative. She states she does get light headed/dizzy spells due to low blood sugar and this is easily fixed with food. She will still have some random dizzy spells that will come on with no precipitating factors.  She states they will last a few seconds and then pass, but sometimes they last longer than others. She states it was never diagnosed as vertigo. She states it can be with just standing, but also with sitting. She states she is used to it, so doesn't really pay much attention to it. She gets these spells 1-2x month.  Has not been correlated with low blood pressure. Denies any chest pain, palpitations.   She had MRI of her head done with ENT and this was normal. 12/2018 with [facility].   She states when she had her EGD done, before medication was given her BP was low and she wanted to check on this. She has a cuff and her baseline sitting bp is around 105/68. She has never had associated symptoms or fast heart rate. Electrolytes all normal. NO weight loss. She does have fatigue, joint pain and some chronic GI symptoms. +ANA.   Review of Systems  Respiratory: Negative for cough, shortness of breath and wheezing.   Cardiovascular: Negative for chest pain and palpitations.  Neurological: Negative for dizziness, light-headedness and headaches.  Psychiatric/Behavioral: The patient is not nervous/anxious.     Allergies Patient has No Known Allergies.  Past Medical History Patient  has a past medical history of Allergy, Anxiety (05/02/2017), Arthralgia (05/02/2017), Owens Shark recluse spider bite, GERD  (gastroesophageal reflux disease), Positive ANA (antinuclear antibody) (05/02/2017), and Urticaria.  Surgical History Patient  has a past surgical history that includes Leg Surgery (Right).  Family History Pateint's family history includes Colon cancer in her maternal grandmother and another family member; Colon polyps in her mother; Hypercalcemia in her mother; Hypothyroidism in her maternal grandmother; Thyroid disease in her mother.  Social History Patient  reports that she quit smoking about 8 years ago. Her smoking use included cigarettes. She has a 5.00 pack-year smoking history. She has never used smokeless tobacco. She reports current alcohol use. She reports that she does not use drugs.    Objective: Vitals:   07/03/19 1114 07/03/19 1139 07/03/19 1140 07/03/19 1141  BP: 100/72     Pulse: 66     Temp: 97.8 F (36.6 C)     TempSrc: Temporal     SpO2: 93% 100% 94% 100%  Weight: 173 lb 6.4 oz (78.7 kg)     Height: 5\' 4"  (1.626 m)       Body mass index is 29.76 kg/m.  Physical Exam Vitals reviewed.  Constitutional:      Appearance: Normal appearance. She is well-developed. She is obese.  HENT:     Head: Normocephalic and atraumatic.     Right Ear: Tympanic membrane, ear canal and external ear normal.     Left Ear: Tympanic membrane, ear canal and external ear normal.  Eyes:     Extraocular Movements: Extraocular movements intact.  Conjunctiva/sclera: Conjunctivae normal.     Pupils: Pupils are equal, round, and reactive to light.  Neck:     Thyroid: No thyromegaly.  Cardiovascular:     Rate and Rhythm: Normal rate and regular rhythm.     Heart sounds: Normal heart sounds. No murmur.  Pulmonary:     Effort: Pulmonary effort is normal.     Breath sounds: Normal breath sounds.  Abdominal:     General: Bowel sounds are normal. There is no distension.     Palpations: Abdomen is soft.     Tenderness: There is no abdominal tenderness.  Musculoskeletal:     Cervical  back: Normal range of motion and neck supple.  Lymphadenopathy:     Cervical: No cervical adenopathy.  Skin:    General: Skin is warm and dry.     Capillary Refill: Capillary refill takes less than 2 seconds.     Findings: No rash.  Neurological:     General: No focal deficit present.     Mental Status: She is alert and oriented to person, place, and time.     Cranial Nerves: No cranial nerve deficit.     Sensory: No sensory deficit.     Motor: No weakness.     Coordination: Coordination normal.     Deep Tendon Reflexes: Reflexes normal.     Comments: +dix halpike to the right   Psychiatric:        Behavior: Behavior normal.       orthostatics normal.   Assessment/plan: 1. Dizziness Chronic issue and has been worked up with ENT with negative recent brain MRI. She did have a slightly positive dix halpike to the right so will give her some home exercises to do TID and meclizine PRN. Her orthostatics are normal with no increased heart rate to consider POTS. She has never passed out. I wonder if some of this oculd be rleated ot her autoimmune issue and she also has a lot of clinical features of fibromyalgia.   Hypotension She is at her baseline today. She has normal pulse and she is asymptomatic. Dizziness could be a symptom, but she is at her baseline blood pressure. She has a cuff and will continue to keep a log at home. Other thoughts would be adrenal insufficiency with her fatigue, hypotension, but I highly doubt this. I do think this is where she lives. If dizziness gets worse or she feels like she is symptomatc with low BP then we will start more of a work up on this.     This visit occurred during the SARS-CoV-2 public health emergency.  Safety protocols were in place, including screening questions prior to the visit, additional usage of staff PPE, and extensive cleaning of exam room while observing appropriate contact time as indicated for disinfecting solutions.     Return if  symptoms worsen or fail to improve.   Orma Flaming, MD [place]   07/03/2019

## 2019-07-03 NOTE — Patient Instructions (Signed)
Adrenal insufficiency... I don't think so, but it's a thought.  -fibromyalgia..   -sent in meclizine to take as needed or dizziness  -do exercises to the right at home.   -muscle relaxer prn.Marland Kitchen

## 2019-08-07 ENCOUNTER — Encounter: Payer: Self-pay | Admitting: Gastroenterology

## 2019-08-07 ENCOUNTER — Ambulatory Visit: Payer: 59 | Admitting: Gastroenterology

## 2019-08-07 ENCOUNTER — Ambulatory Visit (INDEPENDENT_AMBULATORY_CARE_PROVIDER_SITE_OTHER): Payer: 59 | Admitting: Gastroenterology

## 2019-08-07 VITALS — BP 104/62 | HR 73 | Ht 64.0 in | Wt 175.4 lb

## 2019-08-07 DIAGNOSIS — K219 Gastro-esophageal reflux disease without esophagitis: Secondary | ICD-10-CM | POA: Diagnosis not present

## 2019-08-07 DIAGNOSIS — R110 Nausea: Secondary | ICD-10-CM | POA: Diagnosis not present

## 2019-08-07 MED ORDER — ONDANSETRON HCL 4 MG PO TABS: 4.0000 mg | ORAL_TABLET | Freq: Three times a day (TID) | ORAL | 2 refills | Status: DC | PRN

## 2019-08-07 NOTE — Progress Notes (Signed)
Referring Provider: Orma Flaming, MD Primary Care Physician:  Orma Flaming, MD  Reason for Consultation: GERD   IMPRESSION:  Reflux esophagitis incompletely controlled with PPI BID and H2B BID    - biopsies negative for eosinophilic esophagitis Mild H pylori negative gastritis NSAID use for knee pain Family history of colon polyps (mother in her 12s; maternal grandmother with colon cancer in her 36s)  Reflux esophagitis incompletely controlled with PPI BID and H2B BID: Continue PPI BID and H2B BID. Proceed with 24 hour ph probe. Briefly introduced TIF.  Given her family history, colonoscopy recommended starting at age 30.    PLAN: Continue lifestyle modifications Continue pantoprazole 40 mg twice daily Continue famotidine 20 mg BID May refill Zofran at patient request (initially prescribed by [name]) Avoid all NSAIDs 24 hour pH testing if symptoms are not improving (she will call if she wants to scheduled) Colonoscopy for colon cancer screening started 05/2020   Please see the "Patient Instructions" section for addition details about the plan.  HPI: Shannon Gordon is a 39 y.o. female with reflux not responding to daily PPI therapy.  She has seasonal allergies, allergic rhinitis. She is an Office manager.   Initially started Nexium in 2015 for reflux that developed while pregnant.  Took treatment daily for approximately 2 years.  Developed near daily symptoms in February 2021 with reflux, brash and a dry throat. Frequently associated with nausea. Rare dysphonia. No cough. No nocturnal symptoms. Removed carbonated beverages, coffee, spicy foods, dairy, and greasy foods from her diet. No association of symptoms with carbs. Identifies herself as a picky eater. Does not eat before bed. Sleeps with the head of her bed elevated.  Previously used Administrator for breakthrough symptoms.  Did not respond to 10 days of Nexium or one month of Protonix twice daily.   EGD  06/20/19:  - LA Grade A reflux esophagitis with no bleeding. No eosinophilic esophagitis.  - Mild gastropathy. Chronic gastritis with mild reactive changes. Negative for H pylori.  - Normal examined duodenum. - The examination was otherwise normal.  Returns in scheduled follow-up on pantoprazole 40 mg twice daily and famotidine 20 mg BID She has made some lifestyle changes. Elevating her bed.  Watching but she eats, but, she's able to eat pizza.  Overall improving, but, continues to have some symptoms.   Past Medical History:  Diagnosis Date  . Allergy   . Anxiety 05/02/2017   We reviewed hydroxyzine as an option for her anxiety, especially since it is manifested as itching and hives as well.  . Arthralgia 05/02/2017   Patient has been followed by orthopedics in the past.  She does take Mobic and as needed tramadol.  I am okay with taking his medications over.  Owens Shark recluse spider bite   . GERD (gastroesophageal reflux disease)   . Positive ANA (antinuclear antibody) 05/02/2017   Patient already follows with rheumatology.  . Urticaria     Past Surgical History:  Procedure Laterality Date  . LEG SURGERY Right    right lower leg tissue removal from brown recluse spider bite    Current Outpatient Medications  Medication Sig Dispense Refill  . baclofen (LIORESAL) 20 MG tablet Take 1 tablet (20 mg total) by mouth 2 (two) times daily. 30 each 0  . cetirizine (ZYRTEC) 10 MG tablet Take 1 tablet (10 mg total) by mouth 2 (two) times daily. 30 tablet 5  . famotidine (PEPCID) 20 MG tablet Take 1 tablet (20 mg total)  by mouth 2 (two) times daily. 60 tablet 1  . fluocinonide (LIDEX) 0.05 % external solution 1 drop each ear daily    . fluticasone (FLONASE) 50 MCG/ACT nasal spray Place 1 spray into both nostrils daily as needed.     . meclizine (ANTIVERT) 25 MG tablet Take 1 tablet (25 mg total) by mouth 3 (three) times daily as needed for dizziness. 30 tablet 0  . nystatin-triamcinolone (MYCOLOG  II) cream nystatin-triamcinolone 100,000 unit/g-0.1 % topical cream  APP EXT AA BID FOR 7 DAYS PRN    . ondansetron (ZOFRAN) 4 MG tablet Take 1 tablet (4 mg total) by mouth every 8 (eight) hours as needed for nausea or vomiting. 30 tablet 0  . pantoprazole (PROTONIX) 40 MG tablet Take 1 tablet (40 mg total) by mouth 2 (two) times daily. 90 tablet 5  . triamcinolone cream (KENALOG) 0.1 %     . valACYclovir (VALTREX) 500 MG tablet valacyclovir 500 mg tablet  TAKE 1 TABLET BY MOUTH TWICE DAILY DURING OUTBREAKS FOR 3 DAYS     No current facility-administered medications for this visit.    Allergies as of 08/07/2019  . (No Known Allergies)    Family History  Problem Relation Age of Onset  . Thyroid disease Mother   . Hypercalcemia Mother   . Colon polyps Mother   . Hypothyroidism Maternal Grandmother   . Colon cancer Maternal Grandmother   . Colon cancer Other        mat. great GF, mat great Aunt , mat GF's sister  . Esophageal cancer Neg Hx   . Rectal cancer Neg Hx   . Stomach cancer Neg Hx     Social History   Socioeconomic History  . Marital status: Married    Spouse name: Not on file  . Number of children: 1  . Years of education: 18  . Highest education level: Bachelor's degree (e.g., BA, AB, BS)  Occupational History  . Occupation: Oceanographer  Tobacco Use  . Smoking status: Former Smoker    Packs/day: 0.50    Years: 10.00    Pack years: 5.00    Types: Cigarettes    Quit date: 2013    Years since quitting: 8.5  . Smokeless tobacco: Never Used  Vaping Use  . Vaping Use: Never used  Substance and Sexual Activity  . Alcohol use: Yes    Comment: occ use only   . Drug use: Never  . Sexual activity: Yes    Birth control/protection: I.U.D.  Other Topics Concern  . Not on file  Social History Narrative   Married   1 5/yo child (born in 2015)   Works for a preschool 2 days a week   Social Determinants of Radio broadcast assistant Strain:   .  Difficulty of Paying Living Expenses:   Food Insecurity:   . Worried About Charity fundraiser in the Last Year:   . Arboriculturist in the Last Year:   Transportation Needs:   . Film/video editor (Medical):   Marland Kitchen Lack of Transportation (Non-Medical):   Physical Activity:   . Days of Exercise per Week:   . Minutes of Exercise per Session:   Stress:   . Feeling of Stress :   Social Connections:   . Frequency of Communication with Friends and Family:   . Frequency of Social Gatherings with Friends and Family:   . Attends Religious Services:   . Active Member of Clubs  or Organizations:   . Attends Archivist Meetings:   Marland Kitchen Marital Status:   Intimate Partner Violence:   . Fear of Current or Ex-Partner:   . Emotionally Abused:   Marland Kitchen Physically Abused:   . Sexually Abused:     Physical Exam: General:   Alert,  well-nourished, pleasant and cooperative in NAD Abdomen:  Soft, nontender, nondistended, normal bowel sounds, no rebound or guarding. No hepatosplenomegaly.   Extremities:  No clubbing or edema. Neurologic:  Alert and  oriented x4;  grossly nonfocal Skin:  Intact without significant lesions or rashes. Psych:  Alert and cooperative. Normal mood and affect.   Zia Najera L. Tarri Glenn, MD, MPH 08/07/2019, 2:41 PM

## 2019-08-07 NOTE — Patient Instructions (Signed)
Continue Lifestyle modifications  Continue Pantoprazole 40 mg twice daily  Continue Famotidine 20 mg twice daily  We will refill your Zofran and send to your pharmacy today  Avoid NSAIDS  I appreciate the  opportunity to care for you  Thank You   Santo Held

## 2019-09-02 ENCOUNTER — Encounter: Payer: Self-pay | Admitting: Gastroenterology

## 2019-09-02 ENCOUNTER — Ambulatory Visit (INDEPENDENT_AMBULATORY_CARE_PROVIDER_SITE_OTHER): Payer: 59 | Admitting: Gastroenterology

## 2019-09-02 VITALS — BP 110/70 | HR 78 | Ht 64.0 in | Wt 178.4 lb

## 2019-09-02 DIAGNOSIS — K2210 Ulcer of esophagus without bleeding: Secondary | ICD-10-CM

## 2019-09-02 DIAGNOSIS — K2100 Gastro-esophageal reflux disease with esophagitis, without bleeding: Secondary | ICD-10-CM | POA: Diagnosis not present

## 2019-09-02 NOTE — Progress Notes (Signed)
P  Chief Complaint:    GERD, discuss antireflux surgical options  Referring Physician: Thornton Park, MD  GI History: Andreka Stucki is a 39 year old female referred to me by Dr. Tarri Glenn for evaluation of possible antireflux intervention with Transoral Incisionless Fundoplication (TIF) with a goal to stop or significantly reduce acid suppression therapy.  Longstanding history of GERD complicated by erosive esophagitis and symptoms incompletely controlled despite high-dose PPI and H2 blockers.  Symptoms have worsened since 03/2019.  We will have breakthrough reflux symptoms with any missed doses.  Uses Alka-Seltzer for breakthrough.  More recently has been using Alka-Seltzer 2-3 days/week despite high-dose PPI and H2 RA.  Sleeps with HOB elevated (incline bed).  GERD history: -Index symptoms: Heartburn, regurgitation, waterbrash (rare), increased throat clearing, nausea, dyspepsia; no dyspagia -Exacerbating features: Carbonated beverages, coffee, spicy, greasy foods, chocolate, dairy, overeating -Medications trialed: Esomeprazole, Alka-Seltzer, pantoprazole, famotidine -Current medications: Pantoprazole 40 mg bid, famotidine 20 mg bid -Complications: EE  GERD evaluation: -Last EGD: 05/2019 -Barium esophagram: N/A -Esophageal Manometry: N/A -pH/Impedance: N/A -Bravo: N/A  Endoscopic History: -EGD (05/2019, Dr. Tarri Glenn): LA Grade A esophagitis, mild non-H. pylori gastritis.  Esophageal biopsies negative for EOE   GERD-HRQL Questionnaire Score: 19/50 (on BID PPI and H2RA) and checked "dissatisfied" with overall satisfaction of current health condition   Review of systems:     No chest pain, no SOB, no fevers, no urinary sx   Past Medical History:  Diagnosis Date  . Allergy   . Anxiety 05/02/2017   We reviewed hydroxyzine as an option for her anxiety, especially since it is manifested as itching and hives as well.  . Arthralgia 05/02/2017   Patient has been followed by  orthopedics in the past.  She does take Mobic and as needed tramadol.  I am okay with taking his medications over.  Owens Shark recluse spider bite   . GERD (gastroesophageal reflux disease)   . Positive ANA (antinuclear antibody) 05/02/2017   Patient already follows with rheumatology.  . Urticaria     Patient's surgical history, family medical history, social history, medications and allergies were all reviewed in Epic    Current Outpatient Medications  Medication Sig Dispense Refill  . baclofen (LIORESAL) 20 MG tablet Take 1 tablet (20 mg total) by mouth 2 (two) times daily. 30 each 0  . cetirizine (ZYRTEC) 10 MG tablet Take 1 tablet (10 mg total) by mouth 2 (two) times daily. 30 tablet 5  . famotidine (PEPCID) 20 MG tablet Take 1 tablet (20 mg total) by mouth 2 (two) times daily. 60 tablet 1  . fluocinonide (LIDEX) 0.05 % external solution 1 drop each ear daily    . fluticasone (FLONASE) 50 MCG/ACT nasal spray Place 1 spray into both nostrils daily as needed.     . meclizine (ANTIVERT) 25 MG tablet Take 1 tablet (25 mg total) by mouth 3 (three) times daily as needed for dizziness. 30 tablet 0  . nystatin-triamcinolone (MYCOLOG II) cream nystatin-triamcinolone 100,000 unit/g-0.1 % topical cream  APP EXT AA BID FOR 7 DAYS PRN    . ondansetron (ZOFRAN) 4 MG tablet Take 1 tablet (4 mg total) by mouth every 8 (eight) hours as needed for nausea or vomiting. 30 tablet 2  . pantoprazole (PROTONIX) 40 MG tablet Take 1 tablet (40 mg total) by mouth 2 (two) times daily. 90 tablet 5  . triamcinolone cream (KENALOG) 0.1 %     . valACYclovir (VALTREX) 500 MG tablet valacyclovir 500 mg tablet  TAKE 1  TABLET BY MOUTH TWICE DAILY DURING OUTBREAKS FOR 3 DAYS     No current facility-administered medications for this visit.    Physical Exam:     BP 110/70 (BP Location: Left Arm, Patient Position: Sitting, Cuff Size: Normal)   Pulse 78   Ht 5\' 4"  (1.626 m)   Wt 178 lb 6 oz (80.9 kg)   BMI 30.62 kg/m    GENERAL:  Pleasant female in NAD PSYCH: : Cooperative, normal affect Musculoskeletal:  Normal muscle tone, normal strength NEURO: Alert and oriented x 3, no focal neurologic deficits   IMPRESSION and PLAN:    1) GERD with Erosive Esophagitis  Genia Perin is a 39 y.o. female with a long-standing history of GERD and erosive esophagitis, incompletely responsive to high-dose PPI plus H2 RA therapy.  She is requesting antireflux surgery with goal of improved/resolved symptoms, resolution of esophagitis, and stopping or significantly reducing acid suppression medications. Discussed the pathophysiology of GERD at length, to include the risks of untreated reflux (ie, strictures, Barrett's Esophagus, EAC, etc) as well as the possible treatment with medications vs antireflux surgery. In particular, we discussed the risks, benefits, and alternatives of Transoral Incisionless Fundoplication (TIF), to include Nissen fundoplication, and the patient wishes to proceed with TIF.   -Continue current acid suppression therapy for now -Continue antireflux lifestyle/dietary modifications -Will submit prior authorization paperwork to insurance -Pending insurance approval, Will schedule for TIF at [facility] with plan for subsequent admission overnight for post-operative observation - NPO at MN prior to the procedure - Confirmed allergies with the patient and no allergies to planned perioperative antibiotics - Again reviewed postoperative dietary and activity restrictions with patient and provided handout - Anticipated 23 hour stay  - Additional recommendations regarding medications and post-operative diet to follow TIF completion  - All questions answered   I spent 40 minutes of time, including in depth chart review, independent review of results as outlined above, communicating results with the patient directly, face-to-face time with the patient, coordinating care, ordering studies and  medications as appropriate, and documentation.          Lavena Bullion ,DO, FACG 09/02/2019, 10:17 AM

## 2019-09-02 NOTE — Patient Instructions (Signed)
Claiborne Billings will contact you to set up your TIF procedure.

## 2019-09-08 ENCOUNTER — Other Ambulatory Visit: Payer: Self-pay | Admitting: Gastroenterology

## 2019-09-08 ENCOUNTER — Telehealth: Payer: Self-pay | Admitting: Gastroenterology

## 2019-09-08 NOTE — Telephone Encounter (Signed)
Spoke to patient who stated she was ready to schedule her TIF procedure. Orders placed for 10/08/19 for TIF at [facility].  Patient called back deciding to waite a few months  out to reschedule. Orders deleted. Patient was asked to call the office when she was ready to reschedule her procedure.

## 2019-09-09 ENCOUNTER — Encounter: Payer: Self-pay | Admitting: Family Medicine

## 2019-09-10 NOTE — Telephone Encounter (Signed)
Spoke with pt's Mother Esteban) to make her aware of new px sent to pharmacy.

## 2019-09-11 ENCOUNTER — Telehealth: Payer: Self-pay

## 2019-09-11 ENCOUNTER — Telehealth (INDEPENDENT_AMBULATORY_CARE_PROVIDER_SITE_OTHER): Payer: 59 | Admitting: Family Medicine

## 2019-09-11 ENCOUNTER — Encounter: Payer: Self-pay | Admitting: Family Medicine

## 2019-09-11 VITALS — Wt 175.0 lb

## 2019-09-11 DIAGNOSIS — J029 Acute pharyngitis, unspecified: Secondary | ICD-10-CM

## 2019-09-11 DIAGNOSIS — K219 Gastro-esophageal reflux disease without esophagitis: Secondary | ICD-10-CM

## 2019-09-11 DIAGNOSIS — B373 Candidiasis of vulva and vagina: Secondary | ICD-10-CM | POA: Diagnosis not present

## 2019-09-11 DIAGNOSIS — Z20818 Contact with and (suspected) exposure to other bacterial communicable diseases: Secondary | ICD-10-CM | POA: Diagnosis not present

## 2019-09-11 DIAGNOSIS — B3731 Acute candidiasis of vulva and vagina: Secondary | ICD-10-CM

## 2019-09-11 MED ORDER — FLUCONAZOLE 150 MG PO TABS: 150.0000 mg | ORAL_TABLET | Freq: Once | ORAL | 0 refills | Status: AC

## 2019-09-11 MED ORDER — AMOXICILLIN 500 MG PO CAPS: 500.0000 mg | ORAL_CAPSULE | Freq: Two times a day (BID) | ORAL | 0 refills | Status: DC

## 2019-09-11 NOTE — Telephone Encounter (Signed)
Spoke to patient who is trying to work out her schedule to have the TIF procedure on 10/08/19. She will contact our office on Monday with an update.

## 2019-09-11 NOTE — Telephone Encounter (Signed)
Patient is calling regarding schedule TIF procedure. She is still waiting on the insurance but said that she has additional questions and wanted to see about getting an appointment since she is trying to coordinate with her schedule and with the doctor's schedule of the appointment as well. Call back number [phone]

## 2019-09-11 NOTE — Progress Notes (Addendum)
Virtual Visit via Video Note  I connected with Shannon Gordon  on 09/11/19 at  5:00 PM EDT by a video enabled telemedicine application and verified that I am speaking with the correct person using two identifiers.  Location patient: home, [place] Location provider:work or home office Persons participating in the virtual visit: patient, provider  I discussed the limitations of evaluation and management by telemedicine and the availability of in person appointments. The patient expressed understanding and agreed to proceed.   HPI:  Acute visit for a sore throat: -started yesterday -symptoms include: sore throat, white stuff in the back of the throat, some tender glands in the neck -she has chronic nasal congestion with allergies, unchanged -daughter diagnosed with strep throat 4 days ago at pediatrician office -denies fevers, vomiting, diarrhea, SOB, cough -denies any other sick contacts  -fully vaccinated for covid19 with pfizer -also gets recurrent vaginitis, particularly with abx and does not respond to OTC options - requests diflucan -also with hx gerd and upset stomach with abx  ROS: See pertinent positives and negatives per HPI.  Past Medical History:  Diagnosis Date  . Allergy   . Anxiety 05/02/2017   We reviewed hydroxyzine as an option for her anxiety, especially since it is manifested as itching and hives as well.  . Arthralgia 05/02/2017   Patient has been followed by orthopedics in the past.  She does take Mobic and as needed tramadol.  I am okay with taking his medications over.  Owens Shark recluse spider bite   . GERD (gastroesophageal reflux disease)   . Positive ANA (antinuclear antibody) 05/02/2017   Patient already follows with rheumatology.  . Urticaria     Past Surgical History:  Procedure Laterality Date  . LEG SURGERY Right    right lower leg tissue removal from brown recluse spider bite    Family History  Problem Relation Age of Onset  . Thyroid disease Mother   .  Hypercalcemia Mother   . Colon polyps Mother   . Hypothyroidism Maternal Grandmother   . Colon cancer Maternal Grandmother   . Colon cancer Other        mat. great GF, mat great Aunt , mat GF's sister  . Esophageal cancer Neg Hx   . Rectal cancer Neg Hx   . Stomach cancer Neg Hx     SOCIAL HX: see hpi   Current Outpatient Medications:  .  baclofen (LIORESAL) 20 MG tablet, Take 1 tablet (20 mg total) by mouth 2 (two) times daily., Disp: 30 each, Rfl: 0 .  cetirizine (ZYRTEC) 10 MG tablet, Take 1 tablet (10 mg total) by mouth 2 (two) times daily., Disp: 30 tablet, Rfl: 5 .  famotidine (PEPCID) 20 MG tablet, Take 1 tablet (20 mg total) by mouth 2 (two) times daily., Disp: 60 tablet, Rfl: 1 .  fluocinonide (LIDEX) 0.05 % external solution, 1 drop each ear daily, Disp: , Rfl:  .  fluticasone (FLONASE) 50 MCG/ACT nasal spray, Place 1 spray into both nostrils daily as needed. , Disp: , Rfl:  .  levonorgestrel (MIRENA) 20 MCG/24HR IUD, 1 each by Intrauterine route once., Disp: , Rfl:  .  meclizine (ANTIVERT) 25 MG tablet, Take 1 tablet (25 mg total) by mouth 3 (three) times daily as needed for dizziness., Disp: 30 tablet, Rfl: 0 .  nystatin-triamcinolone (MYCOLOG II) cream, nystatin-triamcinolone 100,000 unit/g-0.1 % topical cream  APP EXT AA BID FOR 7 DAYS PRN, Disp: , Rfl:  .  ondansetron (ZOFRAN) 4 MG tablet, Take  1 tablet (4 mg total) by mouth every 8 (eight) hours as needed for nausea or vomiting., Disp: 30 tablet, Rfl: 2 .  pantoprazole (PROTONIX) 40 MG tablet, Take 1 tablet (40 mg total) by mouth 2 (two) times daily., Disp: 90 tablet, Rfl: 5 .  triamcinolone cream (KENALOG) 0.1 %, , Disp: , Rfl:  .  valACYclovir (VALTREX) 500 MG tablet, valacyclovir 500 mg tablet  TAKE 1 TABLET BY MOUTH TWICE DAILY DURING OUTBREAKS FOR 3 DAYS, Disp: , Rfl:  .  amoxicillin (AMOXIL) 500 MG capsule, Take 1 capsule (500 mg total) by mouth 2 (two) times daily., Disp: 20 capsule, Rfl: 0 .  fluconazole (DIFLUCAN)  150 MG tablet, Take 1 tablet (150 mg total) by mouth once for 1 dose., Disp: 1 tablet, Rfl: 0  EXAM:  VITALS per patient if applicable:  GENERAL: alert, oriented, appears well and in no acute distress  HEENT: atraumatic, conjunttiva clear, no obvious abnormalities on inspection of external nose and ears, limited video visit exam of the oropharynx as she could not get tongue out of the way and video ina position for a good exam - did catch fleeting glance with soft palate and tonsillar erythema/ton edema mild  NECK: normal movements of the head and neck, pt reports tender ant cervical nodes  LUNGS: on inspection no signs of respiratory distress, breathing rate appears normal, no obvious gross SOB, gasping or wheezing  CV: no obvious cyanosis  MS: moves all visible extremities without noticeable abnormality  PSYCH/NEURO: pleasant and cooperative, no obvious depression or anxiety, speech and thought processing grossly intact  ASSESSMENT AND PLAN:  Discussed the following assessment and plan:  Sore throat  Strep throat exposure  Yeast vaginitis  Gastroesophageal reflux disease, unspecified whether esophagitis present  -we discussed possible serious and likely etiologies, options for evaluation and workup, limitations of telemedicine visit vs in person visit, treatment, treatment risks and precautions. Pt prefers to treat via telemedicine empirically rather then risking or undertaking an in person visit at this moment. Classic symptoms of strep with close exposure. Discussed other causes of symptoms as well, but she preferred to go ahead with empiric treatment for strep with amox 500mg  bid x 10 days. She plans to get a COVID19 test as well and stay home while sick in the interim. Diflucan sent for yeast vaginitis and discussed options for upset stomach and ger. Take abx with food, gerd medication, probiotic. Patient agrees to seek prompt follow up or in person care if worsening, new  symptoms arise, or if is not improving with treatment.   I discussed the assessment and treatment plan with the patient. The patient was provided an opportunity to ask questions and all were answered. The patient agreed with the plan and demonstrated an understanding of the instructions.   The patient was advised to call back or seek an in-person evaluation if the symptoms worsen or if the condition fails to improve as anticipated.   Lucretia Kern, DO

## 2019-09-11 NOTE — Addendum Note (Signed)
Addended by: Lucretia Kern on: 09/11/2019 05:23 PM   Modules accepted: Level of Service

## 2019-09-11 NOTE — Patient Instructions (Signed)
-  I sent the medication(s) we discussed to your pharmacy: Meds ordered this encounter  Medications  . amoxicillin (AMOXIL) 500 MG capsule    Sig: Take 1 capsule (500 mg total) by mouth 2 (two) times daily.    Dispense:  20 capsule    Refill:  0  . fluconazole (DIFLUCAN) 150 MG tablet    Sig: Take 1 tablet (150 mg total) by mouth once for 1 dose.    Dispense:  1 tablet    Refill:  0    Please let us know if you have any questions or concerns regarding this prescription.  I hope you are feeling better soon! Seek care promptly if your symptoms worsen, new concerns arise or you are not improving with treatment.

## 2019-09-17 NOTE — Telephone Encounter (Signed)
Spoke with patient to let her know that Dr Bryan Lemma has booked that day to do procedures in the [facility] as of 09/16/19,and I just got conformation from [facility] that they received all the necessary papers to submit to her insurance. This process can take up to 30 days before knowing if the surgery is covered. Patient stated she would like to try again to schedule the TIF in October or November. I will contact her as soon as Jocelyn Lamer gets back with [place] on insurance. Patient voiced understanding.

## 2019-09-17 NOTE — Telephone Encounter (Signed)
LMOM for patient to call back.

## 2019-09-17 NOTE — Telephone Encounter (Signed)
Patient is calling you back, she states that she is okay with that date and if that is all you were calling her back for then she is okay. states you dont have to call back. She said she knows you are busy. But stated that if there was something else to please call her back!

## 2019-11-07 DIAGNOSIS — R5383 Other fatigue: Secondary | ICD-10-CM | POA: Insufficient documentation

## 2019-11-10 ENCOUNTER — Encounter: Payer: Self-pay | Admitting: Family Medicine

## 2019-11-18 DIAGNOSIS — M79672 Pain in left foot: Secondary | ICD-10-CM | POA: Insufficient documentation

## 2019-12-01 ENCOUNTER — Ambulatory Visit (INDEPENDENT_AMBULATORY_CARE_PROVIDER_SITE_OTHER): Payer: 59 | Admitting: Family Medicine

## 2019-12-01 ENCOUNTER — Encounter: Payer: Self-pay | Admitting: Family Medicine

## 2019-12-01 ENCOUNTER — Other Ambulatory Visit: Payer: Self-pay | Admitting: Family Medicine

## 2019-12-01 ENCOUNTER — Other Ambulatory Visit: Payer: Self-pay

## 2019-12-01 VITALS — BP 114/74 | HR 57 | Temp 97.9°F | Ht 64.0 in | Wt 179.6 lb

## 2019-12-01 DIAGNOSIS — R5383 Other fatigue: Secondary | ICD-10-CM

## 2019-12-01 DIAGNOSIS — Z8616 Personal history of COVID-19: Secondary | ICD-10-CM

## 2019-12-01 DIAGNOSIS — M2550 Pain in unspecified joint: Secondary | ICD-10-CM

## 2019-12-01 DIAGNOSIS — L219 Seborrheic dermatitis, unspecified: Secondary | ICD-10-CM | POA: Diagnosis not present

## 2019-12-01 HISTORY — DX: Personal history of COVID-19: Z86.16

## 2019-12-01 NOTE — Patient Instructions (Signed)
1)cyrotherpay today. This is a seborrheic keraotisis. If doesn't go away then I want to shave off and send for pathology to make sure im right about the seborrheic keratosis.   2) checking labs for fatigue and joint pain. If labs negative and with clinical exam I think you do have fibromylagia. I will send in cymbalta to start daily to treat this. Start with 30mg /day after a month we can increase to 60mg  so keep me posted. See you back in 3 months!     Seborrheic Keratosis A seborrheic keratosis is a common, noncancerous (benign) skin growth. These growths are velvety, waxy, rough, tan, brown, or black spots that appear on the skin. These skin growths can be flat or raised, and scaly. What are the causes? The cause of this condition is not known. What increases the risk? You are more likely to develop this condition if you:  Have a family history of seborrheic keratosis.  Are 50 or older.  Are pregnant.  Have had estrogen replacement therapy. What are the signs or symptoms? Symptoms of this condition include growths on the face, chest, shoulders, back, or other areas. These growths:  Are usually painless, but may become irritated and itchy.  Can be yellow, brown, black, or other colors.  Are slightly raised or have a flat surface.  Are sometimes rough or wart-like in texture.  Are often velvety or waxy on the surface.  Are round or oval-shaped.  Often occur in groups, but may occur as a single growth. How is this diagnosed? This condition is diagnosed with a medical history and physical exam.  A sample of the growth may be tested (skin biopsy).  You may need to see a skin specialist (dermatologist). How is this treated? Treatment is not usually needed for this condition, unless the growths are irritated or bleed often.  You may also choose to have the growths removed if you do not like their appearance. ? Most commonly, these growths are treated with a procedure in which  liquid nitrogen is applied to "freeze" off the growth (cryosurgery). ? They may also be burned off with electricity (electrocautery) or removed by scraping (curettage). Follow these instructions at home:  Watch your growth for any changes.  Keep all follow-up visits as told by your health care provider. This is important.  Do not scratch or pick at the growth or growths. This can cause them to become irritated or infected. Contact a health care provider if:  You suddenly have many new growths.  Your growth bleeds, itches, or hurts.  Your growth suddenly becomes larger or changes color. Summary  A seborrheic keratosis is a common, noncancerous (benign) skin growth.  Treatment is not usually needed for this condition, unless the growths are irritated or bleed often.  Watch your growth for any changes.  Contact a health care provider if you suddenly have many new growths or your growth suddenly becomes larger or changes color.  Keep all follow-up visits as told by your health care provider. This is important. This information is not intended to replace advice given to you by your health care provider. Make sure you discuss any questions you have with your health care provider. Document Revised: 05/31/2017 Document Reviewed: 05/31/2017 Elsevier Patient Education  [place].

## 2019-12-01 NOTE — Progress Notes (Signed)
Patient: Shannon Gordon MRN: [id number] DOB: Dec 01, 1980 PCP: Orma Flaming, MD     Subjective:  Chief Complaint  Patient presents with  . Nevus  . Fatigue  . Joint Pain    HPI: The patient is a 39 y.o. female who presents today for a mole on her right forearm and her left upper back. She denies itching or pain, but notices color change. Her Rheumatologist would like me to rule out fibromylagia and she also has chronic fatigue.   Fatigue:  She remembers christmas of last year. She would put her self to bed at 9:00pm. During quarantine she would sleep 10-12 hours. She has been taking naps. She has not been exercising. She tries to not eat sugar. She denies any blood in her stool, easy bruising. Does get warm flashes at night, but no fever/night sweats. No weight loss, but has gained weight. Rheumatologist wants to make sure her thyroid is okay. I wonder if she has more fibromyalgia.   Had thyroid antibodies done in 2019 and were all negative.   Joint pain Has pain all over. Pain is there, not crippling. She feels it a lot in her hands and wrists. Pain is bad all day. No inflammation or erythema. She was checked for RA and negative. She has pain in her hips, shoulders, knees, neck. She rates pain as a 5/10, but can get worse. Described as stiff in nature. If she could get under joints it would make her feel better. She can't really take NSAIDs due to gastritis/GERD. Doesn't help when she did. She did have a muscle relaxer I gave to her that really helped her neck out a lot. Possible RA in her grandmother, but she is not sure.   Mole on her left mid back Appears dark and is stuck on. Noticed it a few months ago. Not really looking at this area. She isn't sure it has grown and doesn't think it has changed color. No hx of skin cancer.   Review of Systems  Respiratory: Negative for cough and shortness of breath.   Cardiovascular: Negative for chest pain and palpitations.  Neurological:  Negative for dizziness and headaches.    Allergies Patient has No Known Allergies.  Past Medical History Patient  has a past medical history of Allergy, Anxiety (05/02/2017), Arthralgia (05/02/2017), Owens Shark recluse spider bite, GERD (gastroesophageal reflux disease), Positive ANA (antinuclear antibody) (05/02/2017), and Urticaria.  Surgical History Patient  has a past surgical history that includes Leg Surgery (Right).  Family History Pateint's family history includes Colon cancer in her maternal grandmother and another family member; Colon polyps in her mother; Hypercalcemia in her mother; Hypothyroidism in her maternal grandmother; Thyroid disease in her mother.  Social History Patient  reports that she quit smoking about 8 years ago. Her smoking use included cigarettes. She has a 5.00 pack-year smoking history. She has never used smokeless tobacco. She reports current alcohol use. She reports that she does not use drugs.    Objective: Vitals:   12/01/19 1010  BP: 114/74  Pulse: (!) 57  Temp: 97.9 F (36.6 C)  TempSrc: Temporal  SpO2: 98%  Weight: 179 lb 9.6 oz (81.5 kg)  Height: 5\' 4"  (1.626 m)    Body mass index is 30.83 kg/m.  Physical Exam Vitals reviewed.  Constitutional:      Appearance: Normal appearance. She is well-developed. She is obese.  HENT:     Head: Normocephalic and atraumatic.     Right Ear: External ear normal.  Left Ear: External ear normal.  Eyes:     Conjunctiva/sclera: Conjunctivae normal.     Pupils: Pupils are equal, round, and reactive to light.  Neck:     Thyroid: No thyromegaly.  Cardiovascular:     Rate and Rhythm: Normal rate and regular rhythm.     Pulses: Normal pulses.     Heart sounds: Normal heart sounds. No murmur heard.   Pulmonary:     Effort: Pulmonary effort is normal.     Breath sounds: Normal breath sounds.  Abdominal:     General: Bowel sounds are normal. There is no distension.     Palpations: Abdomen is soft.      Tenderness: There is no abdominal tenderness.  Musculoskeletal:        General: Tenderness present. No swelling or deformity.     Cervical back: Normal range of motion and neck supple.     Comments: TTP over bilateral elbows, knees, shoulders, wrists, back, lower back.   Lymphadenopathy:     Cervical: No cervical adenopathy.  Skin:    General: Skin is warm and dry.     Capillary Refill: Capillary refill takes less than 2 seconds.     Findings: No erythema or rash.     Comments: Small brown SK on left upper back.   Neurological:     General: No focal deficit present.     Mental Status: She is alert and oriented to person, place, and time.     Cranial Nerves: No cranial nerve deficit.     Coordination: Coordination normal.     Deep Tendon Reflexes: Reflexes normal.  Psychiatric:        Mood and Affect: Mood normal.        Behavior: Behavior normal.    Verbal consent obtained for cyrotherapy. 2 rounds of light freeze done. Tolerated well. No complications. Wound care given.     Assessment/plan: 1. Seborrheic dermatitis Discussed benign nature and handout given. Cryotherapy today. If continues to bother her, grows, etc. Will shave off.   2. Arthralgia, unspecified joint I do think she has more fibromylagia. Work up with rheum was negative. Will check thyroid labs and other labs and discussed cymbalta and importance of exercise, weight loss and yoga. She is interested in cymbalta. If lab work up negative we will start this at 30mg . See her back in 3 months.  - CBC with Differential/Platelet; Future - Comprehensive metabolic panel; Future - C-reactive protein; Future - Sedimentation rate; Future - T4, free; Future - TSH; Future - Uric acid; Future - T3, free; Future  3. Other fatigue See above.  - VITAMIN D 25 Hydroxy (Vit-D Deficiency, Fractures); Future - Vitamin B12; Future - T3, free; Future   This visit occurred during the SARS-CoV-2 public health emergency.  Safety  protocols were in place, including screening questions prior to the visit, additional usage of staff PPE, and extensive cleaning of exam room while observing appropriate contact time as indicated for disinfecting solutions.     Return in about 3 months (around 03/02/2020) for fibro and recheck mole .    Orma Flaming, MD [place]   12/01/2019

## 2019-12-02 LAB — COMPREHENSIVE METABOLIC PANEL
AG Ratio: 2 (calc) (ref 1.0–2.5)
ALT: 9 U/L (ref 6–29)
AST: 16 U/L (ref 10–30)
Albumin: 4.5 g/dL (ref 3.6–5.1)
Alkaline phosphatase (APISO): 64 U/L (ref 31–125)
BUN: 12 mg/dL (ref 7–25)
CO2: 30 mmol/L (ref 20–32)
Calcium: 10 mg/dL (ref 8.6–10.2)
Chloride: 104 mmol/L (ref 98–110)
Creat: 0.73 mg/dL (ref 0.50–1.10)
Globulin: 2.3 g/dL (calc) (ref 1.9–3.7)
Glucose, Bld: 70 mg/dL (ref 65–99)
Potassium: 4 mmol/L (ref 3.5–5.3)
Sodium: 140 mmol/L (ref 135–146)
Total Bilirubin: 0.4 mg/dL (ref 0.2–1.2)
Total Protein: 6.8 g/dL (ref 6.1–8.1)

## 2019-12-02 LAB — VITAMIN B12: Vitamin B-12: 487 pg/mL (ref 200–1100)

## 2019-12-02 LAB — CBC WITH DIFFERENTIAL/PLATELET
Absolute Monocytes: 363 cells/uL (ref 200–950)
Basophils Absolute: 50 cells/uL (ref 0–200)
Basophils Relative: 0.9 %
Eosinophils Absolute: 83 cells/uL (ref 15–500)
Eosinophils Relative: 1.5 %
HCT: 41.9 % (ref 35.0–45.0)
Hemoglobin: 14.1 g/dL (ref 11.7–15.5)
Lymphs Abs: 1733 cells/uL (ref 850–3900)
MCH: 31.3 pg (ref 27.0–33.0)
MCHC: 33.7 g/dL (ref 32.0–36.0)
MCV: 92.9 fL (ref 80.0–100.0)
MPV: 9.5 fL (ref 7.5–12.5)
Monocytes Relative: 6.6 %
Neutro Abs: 3273 cells/uL (ref 1500–7800)
Neutrophils Relative %: 59.5 %
Platelets: 257 10*3/uL (ref 140–400)
RBC: 4.51 10*6/uL (ref 3.80–5.10)
RDW: 12.3 % (ref 11.0–15.0)
Total Lymphocyte: 31.5 %
WBC: 5.5 10*3/uL (ref 3.8–10.8)

## 2019-12-02 LAB — TSH: TSH: 1.41 mIU/L

## 2019-12-02 LAB — URIC ACID: Uric Acid, Serum: 3.9 mg/dL (ref 2.5–7.0)

## 2019-12-02 LAB — T3, FREE: T3, Free: 3 pg/mL (ref 2.3–4.2)

## 2019-12-02 LAB — SEDIMENTATION RATE: Sed Rate: 2 mm/h (ref 0–20)

## 2019-12-02 LAB — VITAMIN D 25 HYDROXY (VIT D DEFICIENCY, FRACTURES): Vit D, 25-Hydroxy: 37 ng/mL (ref 30–100)

## 2019-12-02 LAB — C-REACTIVE PROTEIN: CRP: 1.8 mg/L (ref <8.0)

## 2019-12-02 LAB — T4, FREE: Free T4: 1.4 ng/dL (ref 0.8–1.8)

## 2019-12-03 ENCOUNTER — Ambulatory Visit: Payer: 59 | Admitting: Family Medicine

## 2019-12-17 ENCOUNTER — Encounter: Payer: Self-pay | Admitting: Family Medicine

## 2019-12-29 ENCOUNTER — Encounter: Payer: Self-pay | Admitting: Family Medicine

## 2019-12-30 NOTE — Telephone Encounter (Signed)
Please schedule virtual visit with pt.  Thank You! 

## 2019-12-30 NOTE — Telephone Encounter (Signed)
Patient is scheduled for an appointment.

## 2019-12-31 ENCOUNTER — Encounter: Payer: Self-pay | Admitting: Family Medicine

## 2019-12-31 ENCOUNTER — Telehealth (INDEPENDENT_AMBULATORY_CARE_PROVIDER_SITE_OTHER): Payer: 59 | Admitting: Family Medicine

## 2019-12-31 VITALS — Temp 98.5°F | Ht 64.0 in | Wt 179.0 lb

## 2019-12-31 DIAGNOSIS — U071 COVID-19: Secondary | ICD-10-CM | POA: Diagnosis not present

## 2019-12-31 MED ORDER — PULMICORT FLEXHALER 180 MCG/ACT IN AEPB: 2.0000 | INHALATION_SPRAY | Freq: Two times a day (BID) | RESPIRATORY_TRACT | 0 refills | Status: DC

## 2019-12-31 MED ORDER — FLUOXETINE HCL 20 MG PO TABS: 20.0000 mg | ORAL_TABLET | Freq: Every day | ORAL | 0 refills | Status: DC

## 2019-12-31 MED ORDER — FLUOXETINE HCL 10 MG PO CAPS: 10.0000 mg | ORAL_CAPSULE | Freq: Every day | ORAL | 0 refills | Status: DC

## 2019-12-31 NOTE — Patient Instructions (Addendum)
D3: 5000IU/day Vitamin C: 3000mg /day Zinc: 100mg /day Quercetin 500mg  twice a day Melatonin extended release 10mg /nightly  Aspirin: 81mg /day Tumeric: 500mg /twice a day.   Rinse with mouth wash three times a day  -sent in prozac 30mg /day for 10 days -ivermectin sent to [facility]. Must take this with FOOD!!! Do not take with quercetin.  -pulmicort sent in as well to start if you feel like you are getting short of breath.   Please email or call me if you start to feel short of breath/worsening cough so I can send in oral steroids. I do not start these early, timing is everything.  [phone]  You got this! Aw

## 2019-12-31 NOTE — Progress Notes (Signed)
Patient: Shannon Gordon MRN: [id number] DOB: 1980/09/01 PCP: Orma Flaming, MD     I connected with Jose Persia on 12/31/19 at 10:16am by a video enabled telemedicine application and verified that I am speaking with the correct person using two identifiers.  Location patient: Home Location provider: [facility], Office Persons participating in this virtual visit: Tessia Kassin and [name]   I discussed the limitations of evaluation and management by telemedicine and the availability of in person appointments. The patient expressed understanding and agreed to proceed.   Subjective:  Chief Complaint  Patient presents with  . Covid Positive    Symptoms started on Saturday    HPI: The patient is a 39 y.o. female who presents today for positive Covid results. Symptoms started Saturday with a runny nose and then she had fever on Sunday. She felt like she had the flu. She went to an urgent care on Sunday and got a flu test and covid test. Her rapid and PCR covid tests were positive.  She has felt the worse on Sunday and Monday. She has not had fever since Monday. Her cough started on Sunday and is dry in nature. She was taking robitussin DM and then was given a zpack at the urgent care on Sunday. She feels a little short of breath, but no wheezing. She did go on a walk yesterday. She has been taking the nutraceutical bundle that I had written down for her previously. Likely got from her husband.   She is vaccinated for covid.     Review of Systems  Constitutional: Negative for chills and fever.  Respiratory: Positive for cough.   Cardiovascular: Negative for chest pain and palpitations.  Neurological: Negative for dizziness and headaches.    Allergies Patient has No Known Allergies.  Past Medical History Patient  has a past medical history of Allergy, Anxiety (05/02/2017), Arthralgia (05/02/2017), Owens Shark recluse spider bite, GERD (gastroesophageal reflux disease), Positive ANA  (antinuclear antibody) (05/02/2017), and Urticaria.  Surgical History Patient  has a past surgical history that includes Leg Surgery (Right).  Family History Pateint's family history includes Colon cancer in her maternal grandmother and another family member; Colon polyps in her mother; Hypercalcemia in her mother; Hypothyroidism in her maternal grandmother; Thyroid disease in her mother.  Social History Patient  reports that she quit smoking about 8 years ago. Her smoking use included cigarettes. She has a 5.00 pack-year smoking history. She has never used smokeless tobacco. She reports current alcohol use. She reports that she does not use drugs.    Objective: Vitals:   12/31/19 0957  Temp: 98.5 F (36.9 C)  TempSrc: Temporal  Weight: 179 lb (81.2 kg)  Height: 5\' 4"  (1.626 m)    Body mass index is 30.73 kg/m.  Physical Exam Vitals reviewed.  Constitutional:      General: She is not in acute distress.    Appearance: Normal appearance. She is not ill-appearing.  HENT:     Head: Normocephalic and atraumatic.  Pulmonary:     Effort: Pulmonary effort is normal.     Comments: Talking in full complete sentences. No increased effort/work. No retractions. Dry cough  Skin:    Capillary Refill: Capillary refill takes less than 2 seconds.  Neurological:     General: No focal deficit present.     Mental Status: She is alert and oriented to person, place, and time.  Psychiatric:        Mood and Affect: Mood normal.  Behavior: Behavior normal.        Assessment/plan: 1. COVID-19 -starting her on treatment nutraceutical bundle including zinc sulfate, vit D, vit C, quercetin and melatonin extended release and tumeric 5-15+ days depending on symptoms. She has already started this.  -iodine 1% nasal drop 1-2x/day -pulmicort inhaler BID - discussed if worsening pulm symptoms will start her on oral steroids, but no indication this early on.  -prozac 30mg /day x 10 days in lei of  luvox.  -ivermectin X5 days. Discussed with family that this is still considered experimental; however, multiple RCT have been done that have shown significant benefit with it's use. They would like to proceed. Discussed to take with food.  -ASA 81mg  (no contraindication) x5-15 days. May not tolerate with her stomach.  -discussed hold all NSAIDs, would just do tylenol since on ASA.  -azithromycin given by urgent care  -gargle mouthwash TID -outside as much as possible.  -pulse ox >93-94% -prone sleeping if possible.  -strict precautions given.   Return if symptoms worsen or fail to improve.      Orma Flaming, MD [place]  12/31/2019

## 2020-01-02 ENCOUNTER — Telehealth: Payer: Self-pay

## 2020-01-02 MED ORDER — FLOVENT HFA 110 MCG/ACT IN AERO: 1.0000 | INHALATION_SPRAY | Freq: Two times a day (BID) | RESPIRATORY_TRACT | 0 refills | Status: DC

## 2020-01-02 NOTE — Telephone Encounter (Signed)
Gave message to pt below. She voiced understanding and will pick up from pharmacy.

## 2020-01-02 NOTE — Telephone Encounter (Signed)
Pt.'s insurance wont cover the inhaler that was prescribed. She is asking for a different one.

## 2020-01-02 NOTE — Telephone Encounter (Signed)
Sent in alternative therapy. Called flovent. Use twice a day if needed.  Shannon Flaming, MD [place]

## 2020-01-07 ENCOUNTER — Telehealth: Payer: Self-pay

## 2020-01-07 NOTE — Telephone Encounter (Signed)
LMOM for Katie to call back

## 2020-01-07 NOTE — Telephone Encounter (Signed)
Katie with [employer] called in regards to a case in their provider portal for TIF for patient and has some questions about it.  Can be reached at [phone].

## 2020-01-07 NOTE — Telephone Encounter (Signed)
Spoke to [place] from [facility] who reports they submitted the wrong NPI number in there system.  They have resubmitted a new appeal for PA.

## 2020-01-08 ENCOUNTER — Encounter: Payer: Self-pay | Admitting: Family Medicine

## 2020-01-09 MED ORDER — BENZONATATE 200 MG PO CAPS: 200.0000 mg | ORAL_CAPSULE | Freq: Two times a day (BID) | ORAL | 0 refills | Status: DC | PRN

## 2020-01-12 ENCOUNTER — Other Ambulatory Visit: Payer: Self-pay | Admitting: Family Medicine

## 2020-01-12 MED ORDER — FLUOXETINE HCL 20 MG PO TABS: 20.0000 mg | ORAL_TABLET | Freq: Every day | ORAL | 1 refills | Status: DC

## 2020-01-20 ENCOUNTER — Encounter: Payer: Self-pay | Admitting: Family Medicine

## 2020-01-21 ENCOUNTER — Other Ambulatory Visit: Payer: Self-pay | Admitting: Family Medicine

## 2020-01-21 DIAGNOSIS — U071 COVID-19: Secondary | ICD-10-CM

## 2020-02-13 ENCOUNTER — Other Ambulatory Visit: Payer: Self-pay | Admitting: Family Medicine

## 2020-02-23 ENCOUNTER — Other Ambulatory Visit: Payer: Self-pay

## 2020-02-23 ENCOUNTER — Telehealth (INDEPENDENT_AMBULATORY_CARE_PROVIDER_SITE_OTHER): Payer: 59 | Admitting: Family Medicine

## 2020-02-23 ENCOUNTER — Encounter: Payer: Self-pay | Admitting: Family Medicine

## 2020-02-23 VITALS — Ht 64.0 in | Wt 179.0 lb

## 2020-02-23 DIAGNOSIS — J0100 Acute maxillary sinusitis, unspecified: Secondary | ICD-10-CM | POA: Diagnosis not present

## 2020-02-23 MED ORDER — IPRATROPIUM BROMIDE 0.03 % NA SOLN: 2.0000 | Freq: Three times a day (TID) | NASAL | 1 refills | Status: DC

## 2020-02-23 NOTE — Progress Notes (Signed)
Patient: Shannon Gordon MRN: [id number] DOB: 04/27/1980 PCP: Orma Flaming, MD     I connected with Jose Persia on 02/23/20 at 2:50pm by a video enabled telemedicine application and verified that I am speaking with the correct person using two identifiers.  Location patient: Home Location provider: [facility], Office Persons participating in this virtual visit: Zeidy Tayag and [name]   I discussed the limitations of evaluation and management by telemedicine and the availability of in person appointments. The patient expressed understanding and agreed to proceed.   Subjective:  Chief Complaint  Patient presents with  . Sinusitis    HPI: The patient is a 40 y.o. female who presents today for symptoms of sinus pain and pressure in face. Started 2 days ago. She had achy and burning in her left ear and pain and pressure in her sinuses. She doesn't think she has had any fevers. No coughing, but she can feel drainage down the back of her throat.  She has some blood when she blows her nose. She does try to use saline spray and is going to get a humidifier. She is currently taking allergy medicine which has helped with draining. No one else has been sick. She had covid back in November. She doesn't have any sinus pain or pressure today. Her teeth do not hurt. No palpable lymph nodes. She is actually feeling better today. No sick contacts and she has been home all week.   Review of Systems  Constitutional: Negative for chills, fatigue and fever.  HENT: Positive for ear pain, sinus pressure and sinus pain. Negative for sore throat.   Respiratory: Negative for cough and shortness of breath.   Neurological: Negative for dizziness and headaches.    Allergies Patient has No Known Allergies.  Past Medical History Patient  has a past medical history of Allergy, Anxiety (05/02/2017), Arthralgia (05/02/2017), Owens Shark recluse spider bite, GERD (gastroesophageal reflux disease), Positive ANA  (antinuclear antibody) (05/02/2017), and Urticaria.  Surgical History Patient  has a past surgical history that includes Leg Surgery (Right).  Family History Pateint's family history includes Colon cancer in her maternal grandmother and another family member; Colon polyps in her mother; Hypercalcemia in her mother; Hypothyroidism in her maternal grandmother; Thyroid disease in her mother.  Social History Patient  reports that she quit smoking about 9 years ago. Her smoking use included cigarettes. She has a 5.00 pack-year smoking history. She has never used smokeless tobacco. She reports current alcohol use. She reports that she does not use drugs.    Objective: Vitals:   02/23/20 1425  Weight: 179 lb 0.2 oz (81.2 kg)  Height: 5\' 4"  (1.626 m)    Body mass index is 30.73 kg/m.  Physical Exam Vitals reviewed.  Constitutional:      General: She is not in acute distress.    Appearance: Normal appearance. She is not ill-appearing.  HENT:     Head: Normocephalic and atraumatic.     Comments: No TTP over sinuses Pulmonary:     Effort: Pulmonary effort is normal.  Skin:    Capillary Refill: Capillary refill takes less than 2 seconds.  Neurological:     General: No focal deficit present.     Mental Status: She is alert and oriented to person, place, and time.  Psychiatric:        Mood and Affect: Mood normal.        Behavior: Behavior normal.        Assessment/plan:  1. Acute non-recurrent maxillary  sinusitis Viral vs. Allergic rhinitis. No sign of bacterial infection. Discussed no signs indication for antibiotics at this time. Continue with conservative therapy with cool mist humidifier, and will send in atrovent nasal spray since already having some nose bleeds. Concern  flonase would exacerbate this. She is to keep in touch and let me know if not getting better or if ear is hurting. She can swing by clinic and I can look in ear if that is the case if im back at work.      Return if symptoms worsen or fail to improve.     Orma Flaming, MD [place]  02/23/2020

## 2020-02-24 ENCOUNTER — Telehealth: Payer: Self-pay | Admitting: Gastroenterology

## 2020-02-24 ENCOUNTER — Other Ambulatory Visit: Payer: Self-pay | Admitting: Gastroenterology

## 2020-02-24 DIAGNOSIS — K2100 Gastro-esophageal reflux disease with esophagitis, without bleeding: Secondary | ICD-10-CM

## 2020-02-24 DIAGNOSIS — K2210 Ulcer of esophagus without bleeding: Secondary | ICD-10-CM

## 2020-02-24 NOTE — Telephone Encounter (Signed)
Received certified mail letter from [employer], an independent review organization, on behalf of [place] [employer].  During its Independent External Review, they reached a determination of Overturned.  This means that the health insurance insurer will now provide service/payment for Transoral Incisionless Fundoplication (TIF).  Please contact the patient to let her know that she now has approval to move forward with TIF.  Please plan to schedule for TIF in the Endoscopy Unit at [facility].  Please ensure that she has all postop recovery documents to include dietary and activity/exercise recommendations.  Please scan a copy of the document into her electronic record.

## 2020-02-24 NOTE — Telephone Encounter (Signed)
Patient is scheduled for TIF procedure on 04/07/20 [facility]. We went over post op diet and activity recommendations. She will also receive copies of the information in the mail. Follow up office visit scheduled for 04/22/20. Patient will call the office with any questions or concerns.

## 2020-03-17 ENCOUNTER — Encounter: Payer: Self-pay | Admitting: Family Medicine

## 2020-03-18 ENCOUNTER — Other Ambulatory Visit: Payer: Self-pay

## 2020-03-18 DIAGNOSIS — R110 Nausea: Secondary | ICD-10-CM

## 2020-03-18 MED ORDER — ONDANSETRON HCL 4 MG PO TABS: 4.0000 mg | ORAL_TABLET | Freq: Three times a day (TID) | ORAL | 0 refills | Status: DC | PRN

## 2020-04-01 ENCOUNTER — Encounter (HOSPITAL_COMMUNITY): Payer: Self-pay | Admitting: Gastroenterology

## 2020-04-01 ENCOUNTER — Other Ambulatory Visit: Payer: Self-pay

## 2020-04-05 ENCOUNTER — Other Ambulatory Visit (HOSPITAL_COMMUNITY)
Admission: RE | Admit: 2020-04-05 | Discharge: 2020-04-05 | Disposition: A | Discharge status: Home / Self Care | Payer: 59 | Source: Ambulatory Visit | Attending: Gastroenterology | Admitting: Gastroenterology

## 2020-04-05 DIAGNOSIS — Z20822 Contact with and (suspected) exposure to covid-19: Secondary | ICD-10-CM | POA: Insufficient documentation

## 2020-04-05 DIAGNOSIS — Z01812 Encounter for preprocedural laboratory examination: Secondary | ICD-10-CM | POA: Insufficient documentation

## 2020-04-05 LAB — SARS CORONAVIRUS 2 (TAT 6-24 HRS): SARS Coronavirus 2: NEGATIVE

## 2020-04-07 ENCOUNTER — Observation Stay (HOSPITAL_COMMUNITY)
Admission: RE | Admit: 2020-04-07 | Discharge: 2020-04-08 | Disposition: A | Discharge status: Home / Self Care | Payer: 59 | Attending: Gastroenterology | Admitting: Gastroenterology

## 2020-04-07 ENCOUNTER — Encounter (HOSPITAL_COMMUNITY)
Admission: RE | Disposition: A | Discharge status: Home / Self Care | Payer: Self-pay | Source: Home / Self Care | Attending: Gastroenterology

## 2020-04-07 ENCOUNTER — Ambulatory Visit (HOSPITAL_COMMUNITY): Payer: 59 | Admitting: Certified Registered"

## 2020-04-07 ENCOUNTER — Encounter (HOSPITAL_COMMUNITY): Payer: Self-pay | Admitting: Gastroenterology

## 2020-04-07 ENCOUNTER — Other Ambulatory Visit: Payer: Self-pay

## 2020-04-07 DIAGNOSIS — F419 Anxiety disorder, unspecified: Secondary | ICD-10-CM | POA: Insufficient documentation

## 2020-04-07 DIAGNOSIS — K2100 Gastro-esophageal reflux disease with esophagitis, without bleeding: Secondary | ICD-10-CM | POA: Diagnosis not present

## 2020-04-07 DIAGNOSIS — R12 Heartburn: Secondary | ICD-10-CM | POA: Insufficient documentation

## 2020-04-07 DIAGNOSIS — K449 Diaphragmatic hernia without obstruction or gangrene: Secondary | ICD-10-CM | POA: Diagnosis not present

## 2020-04-07 DIAGNOSIS — Z9889 Other specified postprocedural states: Secondary | ICD-10-CM

## 2020-04-07 DIAGNOSIS — K2210 Ulcer of esophagus without bleeding: Secondary | ICD-10-CM

## 2020-04-07 HISTORY — PX: ESOPHAGEAL DILATION: SHX303

## 2020-04-07 HISTORY — DX: Nausea with vomiting, unspecified: R11.2

## 2020-04-07 HISTORY — DX: Other specified postprocedural states: Z98.890

## 2020-04-07 HISTORY — PX: ESOPHAGOGASTRODUODENOSCOPY: SHX5428

## 2020-04-07 HISTORY — PX: TRANSORAL INCISIONLESS FUNDOPLICATION: SHX6840

## 2020-04-07 SURGERY — EGD (ESOPHAGOGASTRODUODENOSCOPY)
Anesthesia: Monitor Anesthesia Care

## 2020-04-07 MED ORDER — LACTATED RINGERS IV SOLN: INTRAVENOUS | Status: DC

## 2020-04-07 MED ORDER — PROMETHAZINE HCL 25 MG/ML IJ SOLN
6.2500 mg | INTRAMUSCULAR | Status: DC | PRN
  Administered 2020-04-07: 12.5 mg via INTRAVENOUS

## 2020-04-07 MED ORDER — SIMETHICONE 80 MG PO CHEW
80.0000 mg | CHEWABLE_TABLET | Freq: Four times a day (QID) | ORAL | Status: DC | PRN
  Administered 2020-04-07: 80 mg via ORAL
  Filled 2020-04-07: qty 1

## 2020-04-07 MED ORDER — SCOPOLAMINE 1 MG/3DAYS TD PT72
MEDICATED_PATCH | TRANSDERMAL | Status: AC
  Filled 2020-04-07: qty 1

## 2020-04-07 MED ORDER — METOCLOPRAMIDE HCL 5 MG/ML IJ SOLN
10.0000 mg | Freq: Four times a day (QID) | INTRAMUSCULAR | Status: DC
  Administered 2020-04-07 – 2020-04-08 (×3): 10 mg via INTRAVENOUS
  Filled 2020-04-07 (×3): qty 2

## 2020-04-07 MED ORDER — SUCCINYLCHOLINE CHLORIDE 200 MG/10ML IV SOSY
PREFILLED_SYRINGE | INTRAVENOUS | Status: DC | PRN
  Administered 2020-04-07: 120 mg via INTRAVENOUS

## 2020-04-07 MED ORDER — SODIUM CHLORIDE 0.9 % IV SOLN: INTRAVENOUS | Status: DC

## 2020-04-07 MED ORDER — SUGAMMADEX SODIUM 200 MG/2ML IV SOLN
INTRAVENOUS | Status: DC | PRN
  Administered 2020-04-07: 200 mg via INTRAVENOUS

## 2020-04-07 MED ORDER — ONDANSETRON HCL 4 MG/2ML IJ SOLN
4.0000 mg | Freq: Once | INTRAMUSCULAR | Status: AC
  Administered 2020-04-07: 4 mg via INTRAVENOUS

## 2020-04-07 MED ORDER — FENTANYL CITRATE (PF) 100 MCG/2ML IJ SOLN
25.0000 ug | INTRAMUSCULAR | Status: DC | PRN
  Administered 2020-04-07 (×2): 50 ug via INTRAVENOUS
  Filled 2020-04-07: qty 2

## 2020-04-07 MED ORDER — CEFAZOLIN SODIUM-DEXTROSE 2-4 GM/100ML-% IV SOLN
2.0000 g | Freq: Once | INTRAVENOUS | Status: AC
  Administered 2020-04-07: 2 g via INTRAVENOUS
  Filled 2020-04-07: qty 100

## 2020-04-07 MED ORDER — DEXAMETHASONE SODIUM PHOSPHATE 10 MG/ML IJ SOLN
8.0000 mg | Freq: Four times a day (QID) | INTRAMUSCULAR | Status: DC
  Administered 2020-04-07 – 2020-04-08 (×3): 8 mg via INTRAVENOUS
  Filled 2020-04-07 (×3): qty 1

## 2020-04-07 MED ORDER — FENTANYL CITRATE (PF) 250 MCG/5ML IJ SOLN
INTRAMUSCULAR | Status: DC | PRN
  Administered 2020-04-07: 100 ug via INTRAVENOUS

## 2020-04-07 MED ORDER — ONDANSETRON HCL 4 MG/2ML IJ SOLN
4.0000 mg | Freq: Four times a day (QID) | INTRAMUSCULAR | Status: DC
  Administered 2020-04-07 – 2020-04-08 (×3): 4 mg via INTRAVENOUS
  Filled 2020-04-07 (×3): qty 2

## 2020-04-07 MED ORDER — PANTOPRAZOLE SODIUM 40 MG PO TBEC
40.0000 mg | DELAYED_RELEASE_TABLET | Freq: Two times a day (BID) | ORAL | Status: DC
  Administered 2020-04-07: 40 mg via ORAL
  Filled 2020-04-07: qty 1

## 2020-04-07 MED ORDER — FAMOTIDINE 40 MG/5ML PO SUSR
20.0000 mg | Freq: Two times a day (BID) | ORAL | Status: DC
  Administered 2020-04-07 (×2): 20 mg via ORAL
  Filled 2020-04-07 (×3): qty 2.5

## 2020-04-07 MED ORDER — LIDOCAINE 2% (20 MG/ML) 5 ML SYRINGE
INTRAMUSCULAR | Status: DC | PRN
  Administered 2020-04-07: 100 mg via INTRAVENOUS

## 2020-04-07 MED ORDER — MIDAZOLAM HCL 2 MG/2ML IJ SOLN
INTRAMUSCULAR | Status: AC
  Filled 2020-04-07: qty 2

## 2020-04-07 MED ORDER — FENTANYL CITRATE (PF) 100 MCG/2ML IJ SOLN
INTRAMUSCULAR | Status: AC
  Filled 2020-04-07: qty 2

## 2020-04-07 MED ORDER — ROCURONIUM BROMIDE 10 MG/ML (PF) SYRINGE
PREFILLED_SYRINGE | INTRAVENOUS | Status: DC | PRN
  Administered 2020-04-07 (×2): 20 mg via INTRAVENOUS

## 2020-04-07 MED ORDER — DEXAMETHASONE SODIUM PHOSPHATE 10 MG/ML IJ SOLN
INTRAMUSCULAR | Status: DC | PRN
  Administered 2020-04-07: 4 mg via INTRAVENOUS

## 2020-04-07 MED ORDER — ACETAMINOPHEN-CODEINE 120-12 MG/5ML PO SOLN
15.0000 mL | ORAL | Status: DC | PRN
  Administered 2020-04-07 – 2020-04-08 (×3): 15 mL via ORAL
  Filled 2020-04-07 (×6): qty 15

## 2020-04-07 MED ORDER — FAMOTIDINE IN NACL 20-0.9 MG/50ML-% IV SOLN
20.0000 mg | Freq: Once | INTRAVENOUS | Status: AC
  Administered 2020-04-07: 20 mg via INTRAVENOUS
  Filled 2020-04-07: qty 50

## 2020-04-07 MED ORDER — SCOPOLAMINE 1 MG/3DAYS TD PT72
1.0000 | MEDICATED_PATCH | Freq: Once | TRANSDERMAL | Status: DC
  Administered 2020-04-07: 1.5 mg via TRANSDERMAL

## 2020-04-07 MED ORDER — LIDOCAINE VISCOUS HCL 2 % MT SOLN
5.0000 mL | Freq: Three times a day (TID) | OROMUCOSAL | Status: DC | PRN
  Administered 2020-04-07: 5 mL via OROMUCOSAL
  Filled 2020-04-07 (×3): qty 15

## 2020-04-07 MED ORDER — PROPOFOL 10 MG/ML IV BOLUS
INTRAVENOUS | Status: AC
  Filled 2020-04-07: qty 20

## 2020-04-07 MED ORDER — PROPOFOL 10 MG/ML IV BOLUS
INTRAVENOUS | Status: DC | PRN
  Administered 2020-04-07: 150 mg via INTRAVENOUS

## 2020-04-07 MED ORDER — MIDAZOLAM HCL 2 MG/2ML IJ SOLN
INTRAMUSCULAR | Status: DC | PRN
  Administered 2020-04-07: 2 mg via INTRAVENOUS

## 2020-04-07 NOTE — Op Note (Signed)
[facility] Patient Name: Shannon Gordon Procedure Date: 04/07/2020 MRN: [id number] Attending MD: Gerrit Heck , MD Date of Birth: [birth year] CSN: [id number] Age: 40 Admit Type: Outpatient Procedure:                Upper GI endoscopy Indications:              Heartburn, Reflux esophagitis, For therapy of                            reflux esophagitis                           40 yo female with long-standing history of GERD                            complicated by erosive esophagitis and incompletely                            controlled with high dose PPI and H2RA, presents                            for Transoral Incisionless Fundoplication. Providers:                Gerrit Heck, MD, Baird Cancer, RN, Clyde Lundborg, RN, Kary Kos RN, RN, Elspeth Cho                            Tech., Technician, Jefm Miles CRNA Referring MD:              Medicines:                General Anesthesia Complications:            No immediate complications. Estimated Blood Loss:     Estimated blood loss was minimal. Procedure:                Pre-Anesthesia Assessment:                           - Prior to the procedure, a History and Physical                            was performed, and patient medications and                            allergies were reviewed. The patient's tolerance of                            previous anesthesia was also reviewed. The risks                            and benefits of the procedure and the sedation                            options and risks  were discussed with the patient.                            All questions were answered, and informed consent                            was obtained. Prior Anticoagulants: The patient has                            taken no previous anticoagulant or antiplatelet                            agents. ASA Grade Assessment: II - A patient with                            mild  systemic disease. After reviewing the risks                            and benefits, the patient was deemed in                            satisfactory condition to undergo the procedure.                           After obtaining informed consent, the endoscope was                            passed under direct vision. Throughout the                            procedure, the patient's blood pressure, pulse, and                            oxygen saturations were monitored continuously. The                            GIF-H190 ([id number]) was introduced through the                            mouth, and advanced to the second part of duodenum.                            The upper GI endoscopy was accomplished without                            difficulty. The patient tolerated the procedure                            well. Scope In: Scope Out: Findings:      The examined esophagus was normal. The scope was withdrawn. Dilation was       performed with a Maloney dilator with mild resistance at 56 Fr. The       dilation site was examined following endoscope reinsertion and showed no  bleeding, mucosal tear or perforation. Estimated blood loss: none.      The Z-line was regular and was found 40 cm from the incisors. The       decision was made to perform transoral fundoplication with the EsophyX       Z+ system. Before the procedure, the gastroesophageal flap valve was       classified as Hill Grade II (fold present, opens with respiration). The       endoscope was withdrawn, placed through the plication device, reinserted       into the patient and advanced past the level of the GE junction at 40 cm       from the incisors and into the stomach. Next, the endoscope was advanced       beyond the device and retroflexed. The first plication site was       identified at the 1 o'clock position. With the device in the proper       position, the helical retractor was deployed and tissue was pulled  into       the mold before it was closed. The device was rotated, suction was       applied using the invaginator, then the device was advanced slightly and       two H-shaped fasteners were placed. The device was reloaded and the       process repeated in order to deploy a total of ten fasteners at the       first site. The device was then rotated to the 11 o'clock position after       which the helical retractor was used to grasp additional tissue within       the mold before rotation and deployment of a total of six fasteners at       the second site. To complete reconstruction of the valve, additional       fasteners were deployed at the following sites: four fasteners at 5       o'clock and four fasteners at 7 o'clock positions. In total, 24       fasteners contributed to create a valve measuring 3 cm in length which       involved 270 degrees of the circumference upon retroflexed view. The       EsophyX device and endoscope were then removed. Relook endoscopy was       performed prior to the conclusion of the case to confirm the above       findings. Estimated blood loss was minimal.      The entire examined stomach was normal.      The examined duodenum was normal. Impression:               - Normal esophagus. Empiric dilation performed                            prior to placement of Esophyx device.                           - Z-line regular, 40 cm from the incisors.                           - Normal stomach.                           -  Normal examined duodenum.                           - EsophyX transoral fundoplication was performed.                           - No specimens collected. Moderate Sedation:      Not Applicable - Patient had care per Anesthesia. Recommendation:           -Transport to PACU for recovery with plan for                            admission to surgical ward for overnight                            observation with anticipated discharge tomorrow                            -Zofran 4 mg IV every 6 hours x24 hours, then prn                           -Reglan 10 mg every 6 hours x24 hours, then prn                           -Resume scopolamine patch x3 days (applied preop)                           -Protonix 40 mg p.o. BID x2 weeks, then 40 mg daily                            x2 weeks, then 20 mg daily x1 week then prn                           -Decadron 8 mg every 6 hours times max 5 doses                           -Gas-X (simethicone) 425 mg p.o. prn every 6 hours                            gas pain, abdominal discomfort                           -Tylenol 3 (APAP 120 mg/codeine 12 mg per 5 mL): 15                            mL's every 4 hours prn pain                           -Colace 100 mg p.o. twice daily if taking pain                            medications                           -  Clear liquid diet okay overnight                           -Okay to ambulate with assist around the ward                           -Please do not hesitate to contact me directly with                            any postoperative questions or concerns Procedure Code(s):        --- Professional ---                           [id number], Esophagogastroduodenoscopy, flexible,                            transoral; with esophagogastric fundoplasty,                            partial or complete, includes duodenoscopy when                            performed                           43450, Dilation of esophagus, by unguided sound or                            bougie, single or multiple passes Diagnosis Code(s):        --- Professional ---                           R12, Heartburn                           K21.00, Gastro-esophageal reflux disease with                            esophagitis, without bleeding CPT copyright 2019 [employer]. All rights reserved. The codes documented in this report are preliminary and upon coder review may  be revised to  meet current compliance requirements. Gerrit Heck, MD 04/07/2020 11:49:19 AM Number of Addenda: 0

## 2020-04-07 NOTE — Anesthesia Procedure Notes (Signed)
Procedure Name: Intubation Date/Time: 04/07/2020 10:55 AM Performed by: Eben Burow, CRNA Pre-anesthesia Checklist: Patient identified, Emergency Drugs available, Suction available, Patient being monitored and Timeout performed Patient Re-evaluated:Patient Re-evaluated prior to induction Oxygen Delivery Method: Circle system utilized Preoxygenation: Pre-oxygenation with 100% oxygen Induction Type: IV induction and Rapid sequence Laryngoscope Size: Mac and 4 Grade View: Grade I Tube type: Oral Tube size: 7.0 mm Number of attempts: 1 Airway Equipment and Method: Stylet Placement Confirmation: ETT inserted through vocal cords under direct vision,  positive ETCO2 and breath sounds checked- equal and bilateral Secured at: 21 cm Tube secured with: Tape Dental Injury: Teeth and Oropharynx as per pre-operative assessment

## 2020-04-07 NOTE — Anesthesia Preprocedure Evaluation (Signed)
Anesthesia Evaluation  Patient identified by MRN, date of birth, ID band Patient awake    Reviewed: Allergy & Precautions, NPO status , Patient's Chart, lab work & pertinent test results  History of Anesthesia Complications (+) PONV and history of anesthetic complications  Airway Mallampati: II  TM Distance: >3 FB Neck ROM: Full    Dental no notable dental hx.    Pulmonary former smoker,    Pulmonary exam normal        Cardiovascular negative cardio ROS Normal cardiovascular exam     Neuro/Psych Anxiety negative neurological ROS     GI/Hepatic Neg liver ROS, GERD  ,  Endo/Other  negative endocrine ROS  Renal/GU negative Renal ROS  negative genitourinary   Musculoskeletal  (+) Arthritis ,   Abdominal   Peds  Hematology negative hematology ROS (+)   Anesthesia Other Findings Day of surgery medications reviewed with patient.  Reproductive/Obstetrics negative OB ROS                             Anesthesia Physical Anesthesia Plan  ASA: II  Anesthesia Plan: MAC   Post-op Pain Management:    Induction:   PONV Risk Score and Plan: Treatment may vary due to age or medical condition and Propofol infusion  Airway Management Planned: Natural Airway and Nasal Cannula  Additional Equipment:   Intra-op Plan:   Post-operative Plan:   Informed Consent: I have reviewed the patients History and Physical, chart, labs and discussed the procedure including the risks, benefits and alternatives for the proposed anesthesia with the patient or authorized representative who has indicated his/her understanding and acceptance.       Plan Discussed with: CRNA  Anesthesia Plan Comments:         Anesthesia Quick Evaluation

## 2020-04-07 NOTE — Anesthesia Postprocedure Evaluation (Signed)
Anesthesia Post Note  Patient: Shannon Gordon  Procedure(s) Performed: ESOPHAGOGASTRODUODENOSCOPY (EGD) (N/A ) TRANSORAL INCISIONLESS FUNDOPLICATION (N/A ) ESOPHAGEAL DILATION     Patient location during evaluation: PACU Anesthesia Type: MAC Level of consciousness: awake and alert and oriented Pain management: pain level controlled Vital Signs Assessment: post-procedure vital signs reviewed and stable Respiratory status: spontaneous breathing, nonlabored ventilation and respiratory function stable Cardiovascular status: blood pressure returned to baseline Postop Assessment: no apparent nausea or vomiting Anesthetic complications: no   No complications documented.  Last Vitals:  Vitals:   04/07/20 1215 04/07/20 1230  BP: 113/80 121/66  Pulse: 80 64  Resp: (!) 23 14  Temp:    SpO2: 97% 100%    Last Pain:  Vitals:   04/07/20 1230  TempSrc:   PainSc: [name]

## 2020-04-07 NOTE — Transfer of Care (Signed)
Immediate Anesthesia Transfer of Care Note  Patient: Shannon Gordon  Procedure(s) Performed: ESOPHAGOGASTRODUODENOSCOPY (EGD) (N/A ) TRANSORAL INCISIONLESS FUNDOPLICATION (N/A ) ESOPHAGEAL DILATION  Patient Location: PACU  Anesthesia Type:General  Level of Consciousness: awake, alert , oriented and patient cooperative  Airway & Oxygen Therapy: Patient Spontanous Breathing and Patient connected to face mask oxygen  Post-op Assessment: Report given to RN and Post -op Vital signs reviewed and stable  Post vital signs: Reviewed and stable  Last Vitals:  Vitals Value Taken Time  BP    Temp    Pulse    Resp    SpO2      Last Pain:  Vitals:   04/07/20 0843  TempSrc: Oral  PainSc: 0-No pain         Complications: No complications documented.

## 2020-04-07 NOTE — H&P (Signed)
P  Chief Complaint:    GERD, presents for antireflux surgery  GI History: Shannon Gordon is a 40 year old female  initially referred to me by Dr. Tarri Glenn for evaluation of possible antireflux intervention with Transoral Incisionless Fundoplication (TIF) with a goal to stop or significantly reduce acid suppression therapy.  Longstanding history of GERD complicated by erosive esophagitis and symptoms incompletely controlled despite high-dose PPI and H2 blockers.  Symptoms have worsened since 03/2019.  We will have breakthrough reflux symptoms with any missed doses.  Uses Alka-Seltzer for breakthrough 2-3 days/week despite high-dose PPI and H2RA.  Sleeps with HOB elevated (incline bed).  GERD history: -Index symptoms: Heartburn, regurgitation, waterbrash (rare), increased throat clearing, nausea, dyspepsia; no dyspagia -Exacerbating features: Carbonated beverages, coffee, spicy, greasy foods, chocolate, dairy, overeating -Medications trialed: Esomeprazole, Alka-Seltzer, pantoprazole, famotidine -Current medications: Pantoprazole 40 mg bid, famotidine 20 mg bid -Complications: EE  GERD evaluation: -Last EGD: 05/2019 -Barium esophagram: N/A -Esophageal Manometry: N/A -pH/Impedance: N/A -Bravo: N/A  Endoscopic History: -EGD (05/2019, Dr. Tarri Glenn): LA Grade A esophagitis, mild non-H. pylori gastritis.  Esophageal biopsies negative for EOE  HPI:     Patient is a 40 y.o. female presenting [facility] endoscopy unit for Transoral Incisionless Fundoplication (TIF).  She has otherwise been in her usual state of health last appointment with me.  Review of systems:     No chest pain, no SOB, no fevers, no urinary sx   Past Medical History:  Diagnosis Date  . Allergy   . Anxiety 05/02/2017   We reviewed hydroxyzine as an option for her anxiety, especially since it is manifested as itching and hives as well.  . Arthralgia 05/02/2017   Patient has been followed by orthopedics in  the past.  She does take Mobic and as needed tramadol.  I am okay with taking his medications over.  Owens Shark recluse spider bite   . GERD (gastroesophageal reflux disease)   . History of COVID-19 12/2019  . PONV (postoperative nausea and vomiting)   . Positive ANA (antinuclear antibody) 05/02/2017   Patient already follows with rheumatology.  . Urticaria     Patient's surgical history, family medical history, social history, medications and allergies were all reviewed in Epic    Current Facility-Administered Medications  Medication Dose Route Frequency Provider Last Rate Last Admin  . ceFAZolin (ANCEF) IVPB 2g/100 mL premix  2 g Intravenous Once Heiress Williamson V, DO      . famotidine (PEPCID) IVPB 20 mg premix  20 mg Intravenous Once Anh Bigos V, DO      . lactated ringers infusion   Intravenous Continuous Shernita Rabinovich V, DO      . ondansetron (ZOFRAN) injection 4 mg  4 mg Intravenous Once Marvin Grabill V, DO      . scopolamine (TRANSDERM-SCOP) 1 MG/3DAYS 1.5 mg  1 patch Transdermal Once Jenissa Tyrell V, DO        Physical Exam:     Ht 5\' 4"  (1.626 m)   Wt 81.6 kg   BMI 30.90 kg/m   GENERAL:  Pleasant female in NAD PSYCH: : Cooperative, normal affect EENT:  conjunctiva pink, mucous membranes moist, neck supple without masses CARDIAC:  RRR, no murmur heard, no peripheral edema PULM: Normal respiratory effort, lungs CTA bilaterally, no wheezing ABDOMEN:  Nondistended, soft, nontender. No obvious masses, no hepatomegaly,  normal bowel sounds SKIN:  turgor, no lesions seen Musculoskeletal:  Normal muscle tone, normal strength NEURO: Alert and oriented x 3, no focal  neurologic deficits   IMPRESSION and PLAN:    1) GERD with erosive esophagitis -Plan for TIF today -We again reviewed risks, benefits, alternatives of antireflux surgeries, with particular attention to the risks/benefits of TIF, and she again wishes to proceed -Again reviewed postoperative dietary  and exercise/activity restrictions -Plan to resume high-dose PPI x2 weeks postoperatively, then slow wean -Other additional recommendations pending completion of this procedure          Lavena Bullion ,DO, FACG 04/07/2020, 8:35 AM

## 2020-04-07 NOTE — Interval H&P Note (Signed)
History and Physical Interval Note:  04/07/2020 8:58 AM  Shannon Gordon  has presented today for surgery, with the diagnosis of erosive espohagitis.  The various methods of treatment have been discussed with the patient and family. After consideration of risks, benefits and other options for treatment, the patient has consented to  Procedure(s): ESOPHAGOGASTRODUODENOSCOPY (EGD) (N/A) TRANSORAL INCISIONLESS FUNDOPLICATION (N/A) as a surgical intervention.  The patient's history has been reviewed, patient examined, no change in status, stable for surgery.  I have reviewed the patient's chart and labs.  Questions were answered to the patient's satisfaction.     Dominic Pea Shannon Gordon

## 2020-04-08 DIAGNOSIS — Z9889 Other specified postprocedural states: Secondary | ICD-10-CM

## 2020-04-08 DIAGNOSIS — K2100 Gastro-esophageal reflux disease with esophagitis, without bleeding: Secondary | ICD-10-CM | POA: Diagnosis not present

## 2020-04-08 MED ORDER — METOCLOPRAMIDE HCL 10 MG PO TABS: 10.0000 mg | ORAL_TABLET | Freq: Four times a day (QID) | ORAL | 1 refills | Status: DC | PRN

## 2020-04-08 MED ORDER — SIMETHICONE 80 MG PO CHEW: 80.0000 mg | CHEWABLE_TABLET | Freq: Four times a day (QID) | ORAL | 2 refills | Status: DC | PRN

## 2020-04-08 MED ORDER — ONDANSETRON HCL 4 MG PO TABS: 4.0000 mg | ORAL_TABLET | Freq: Every day | ORAL | 1 refills | Status: AC | PRN

## 2020-04-08 NOTE — Progress Notes (Signed)
Assessment unchanged. Pt verbalized understanding of dc instructions through teach back. Understands meds and follow up care. Discharged via foot per pt request to front entrance to meet husband, accompanied by RN.

## 2020-04-08 NOTE — Discharge Summary (Signed)
 GASTROENTEROLOGY DISCHARGE SUMMARY  Date of admission: 04/07/2020 Date of discharge: 04/08/2020 Attending: Dr. Bryan Lemma Primary Care provider: Orma Flaming, MD Discharge diagnosis: GERD with esophagitis Consultations: None Procedures performed: EGD with Transoral Incisionless Fundoplication (TIF) History: See admission H&P Exam: See below  Hospital course: 1) GERD 2) Hiatal hernia Shannon Gordon is a 40 y.o. female s/p EGD with Transoral Incisionless Fundoplication (TIF) completed on 06/08/7414 without complications. Did well overnight, without any acute events.  Tolerating liquid diet and oral medications without issue.  Discharge vital signs: See below Discharge labs: None Disposition: Home in stable condition Follow-up appointments: See below  Day of Discharge:  Did well overnight, without any acute events.  Tolerating liquid diet and oral medications without issue.  Objective: Vital signs in last 24 hours: Temp:  [97.8 F (36.6 C)-98.7 F (37.1 C)] 98.2 F (36.8 C) (03/10 0520) Pulse Rate:  [59-89] 75 (03/10 0520) Resp:  [10-23] 16 (03/10 0520) BP: (108-128)/(66-84) 108/73 (03/10 0520) SpO2:  [96 %-100 %] 96 % (03/10 0520) Weight:  [81.6 kg] 81.6 kg (03/09 0843) Last BM Date: 04/06/20 General: NAD Lungs:  CTA b/l, no w/r/r Heart:  RRR, no m/r/g Abdomen:  Mild expected post operative TTP in epigastrium without rebound or guarding, no peritoneal signs. Otherwise, soft, ND, +BS Ext:  No c/c/e    Intake/Output from previous day: 03/09 0701 - 03/10 0700 In: 873.7 [P.O.:80; I.V.:693.7; IV Piggyback:100] Out: 1420 [Urine:1400; Blood:20] Intake/Output this shift: No intake/output data recorded.   Lab Results: No results for input(s): WBC, HGB, PLT, MCV in the last 72 hours. BMET No results for input(s): NA, K, CL, CO2, GLUCOSE, BUN, CREATININE, CALCIUM in the last 72 hours. LFT No results for input(s): PROT, ALBUMIN, AST, ALT, ALKPHOS, BILITOT, BILIDIR,  IBILI in the last 72 hours. PT/INR No results for input(s): INR in the last 72 hours.    Imaging/Other results: No results found.    Assessment and Plan:  Shannon Gordon is a 40 y.o. female s/p EGD with Transoral Incisionless Fundoplication (TIF) completed yesterday with no events on overnight observation. Will plan on d/c to home today with the following plan:   - Protonix 40 mg PO BID for 2 weeks, then 40 mg daily for 2 weeks, then 20 mg daily for 1 week, then prn -Resume Pepcid 20 mg BID for 2 weeks, then 20 mg daily for 1 week, then discontinue - D/c with Zofran 4 mg PO prn Q6 hours for nausea  - D/c with Reglan 10 mg PO prn Q6 hours for nausea  - D/c with Simethicone 80 mg PO prn Q6 hours for bloating/abdominal discomfort - OTC Tylenol as needed for mild postoperative pain  Diet:  - 2 weeks of liquid soft foods followed by 4 weeks slowly progressive diet back to regular  - Provided with handout for post operative diet plan   Post Op Activity:  - Week 1: encourage short distance walking, minimal physical activity, no lifting >5 lbs  - Week 2: Slow climbing stairs, no intense exercise, no lifting >5 lbs  - Week 3-6: No intense exercise, may lift up to 25 lbs  - Week 7: Resume normal activity   - To follow-up with me on 04/27/2020 at 10:40 AM in the [facility] [place], DO  04/08/2020, 8:01 AM [facility] Pager [phone]

## 2020-04-08 NOTE — Discharge Instructions (Signed)
-  See discharge summary for full discharge instructions -To call Dr. Vivia Ewing office with any questions or concerns following discharge -Has already been provided with postoperative dietary and exercise/activity instructions

## 2020-04-09 ENCOUNTER — Encounter (HOSPITAL_COMMUNITY): Payer: Self-pay | Admitting: Gastroenterology

## 2020-04-09 ENCOUNTER — Telehealth: Payer: Self-pay | Admitting: Gastroenterology

## 2020-04-09 NOTE — Telephone Encounter (Signed)
Received a MyChart message from Peyson that she is still having some postoperative pain, mainly odynophagia.  Viscous lidocaine did not seem to be too effective when she was in the hospital, so plan for Tylenol #3.  Recommended that she take Colace 100 mg p.o. twice daily if she does start taking the pain medication.  Olivia Mackie, can you please plan to check in with the patient on Monday to see how she is feeling.  She knows she can call our on-call through the weekend if there are any issues in the interim.  Called in Rx to Pharmacy. No solution available. Entered Rx for Tylenol #3 tablets, #30.

## 2020-04-12 NOTE — Telephone Encounter (Signed)
Spoke with Shannon Gordon and she states she is doing better. Her nausea is still there and she will continue with her Zofran prn. She states she tolerated yogurt and cream of wheat today without nausea.  Encouraged her to continue with the after care eating plan, and call back if any questions. Patient verbalized understanding.

## 2020-04-12 NOTE — Telephone Encounter (Signed)
I spoke with her last evening as well. Thanks for reaching out to follow-up with her today. Glad to hear she is doing better.

## 2020-04-13 NOTE — Telephone Encounter (Signed)
Spoke with patient, she has been rescheduled for her follow up on Tuesday, 05/04/20 at 8:20 AM. Patient states that she is having frequent waves of nausea which causes her to have pain. She states that she tries to take Zofran in the morning and in the evenings, taking Reglan every 6 hours long with Pepcid as directed. Patient is wanting to know if there is an alternative antiemetic that she can try.

## 2020-04-14 MED ORDER — SUCRALFATE 1 GM/10ML PO SUSP
1.0000 g | Freq: Four times a day (QID) | ORAL | 1 refills | Status: DC
Start: 2020-04-14 — End: 2020-05-04

## 2020-04-14 MED ORDER — SCOPOLAMINE 1 MG/3DAYS TD PT72: 1.0000 | MEDICATED_PATCH | TRANSDERMAL | 0 refills | Status: DC

## 2020-04-22 ENCOUNTER — Ambulatory Visit: Payer: 59 | Admitting: Gastroenterology

## 2020-04-27 ENCOUNTER — Ambulatory Visit: Payer: 59 | Admitting: Gastroenterology

## 2020-04-30 ENCOUNTER — Other Ambulatory Visit: Payer: Self-pay | Admitting: General Surgery

## 2020-04-30 DIAGNOSIS — K219 Gastro-esophageal reflux disease without esophagitis: Secondary | ICD-10-CM

## 2020-04-30 MED ORDER — PANTOPRAZOLE SODIUM 40 MG PO TBEC: 40.0000 mg | DELAYED_RELEASE_TABLET | Freq: Two times a day (BID) | ORAL | 5 refills | Status: DC

## 2020-04-30 NOTE — Progress Notes (Signed)
Pt requested refill via mychart message

## 2020-05-04 ENCOUNTER — Encounter: Payer: Self-pay | Admitting: Gastroenterology

## 2020-05-04 ENCOUNTER — Ambulatory Visit (INDEPENDENT_AMBULATORY_CARE_PROVIDER_SITE_OTHER): Payer: 59 | Admitting: Gastroenterology

## 2020-05-04 ENCOUNTER — Other Ambulatory Visit: Payer: Self-pay

## 2020-05-04 VITALS — BP 104/72 | HR 84 | Ht 64.0 in | Wt 166.5 lb

## 2020-05-04 DIAGNOSIS — Z9889 Other specified postprocedural states: Secondary | ICD-10-CM

## 2020-05-04 DIAGNOSIS — K2100 Gastro-esophageal reflux disease with esophagitis, without bleeding: Secondary | ICD-10-CM

## 2020-05-04 NOTE — Progress Notes (Signed)
P  Chief Complaint:    Postoperative follow-up  GI History: Tiea Manninen is a 40 year old femalew/ a longstanding history of GERD complicated by erosive esophagitis and symptoms incompletely controlled despite high-dose PPI and H2 blockers and sleeping with HOB elevated.  Was using Alka-Seltzer for breakthrough 2-3 days/week despite high-dose PPI and H2RA, prompting her to seek antireflux surgery, with completion of Transoral Incisionless Fundoplication (TIF) on 1/0/9323.  GERD history: -Index symptoms:Heartburn, regurgitation, waterbrash (rare), increased throat clearing, nausea, dyspepsia; no dyspagia -Exacerbating features:Carbonated beverages, coffee, spicy, greasy foods, chocolate, dairy, overeating -Medications trialed:Esomeprazole, Alka-Seltzer, pantoprazole, famotidine -Current medications:Pantoprazole 40 mgbid, famotidine 20 mgbid -Complications:EE  GERD evaluation: -Last EGD:05/2019 -Barium esophagram:N/A -Esophageal Manometry:N/A -pH/Impedance:N/A -Bravo:N/A  Endoscopic History: -EGD (05/2019, Dr. Tarri Glenn): LA Grade A esophagitis, mild non-H. pylori gastritis. Esophageal biopsies negative for EOE -EGD with TIF (04/07/2020, Dr. Bryan Lemma): TIF completed with 24 Serofuse fasteners with 3 cm valve length, 270 degrees circumference  HPI:     Patient is a 40 y.o. female presenting to the [facility] for postoperative follow-up.  TIF completed on 04/07/2020, with overnight admission for observation.  Did have some postoperative nausea, controlled Reglan and Zofran, which she has since titrated off.  Also some postoperative pain/odynophagia, treated with Tylenol 3, and again has titrated off.    Today, states she is doing better. Has increased to soft foods, and tolerating without issue.  In total, has lost 16# since surgery. Still taking Protonix 40 mg BID and Pepcid 20 mg/day, and not feeling any breakthrough reflux.  No dysphagia, odynophagia.   Does have occasional aerophagia.  No new labs or abdominal imaging for review.  Review of systems:     No chest pain, no SOB, no fevers, no urinary sx   Past Medical History:  Diagnosis Date  . Allergy   . Anxiety 05/02/2017   We reviewed hydroxyzine as an option for her anxiety, especially since it is manifested as itching and hives as well.  . Arthralgia 05/02/2017   Patient has been followed by orthopedics in the past.  She does take Mobic and as needed tramadol.  I am okay with taking his medications over.  Ow[place] Shark recluse spider bite   . GERD (gastroesophageal reflux disease)   . History of COVID-19 12/2019  . PONV (postoperative nausea and vomiting)   . Positive ANA (antinuclear antibody) 05/02/2017   Patient already follows with rheumatology.  . Urticaria     Patient's surgical history, family medical history, social history, medications and allergies were all reviewed in Epic    Current Outpatient Medications  Medication Sig Dispense Refill  . baclofen (LIORESAL) 20 MG tablet Take 5 mg by mouth daily as needed for muscle spasms.    Marland Kitchen BLACK ELDERBERRY PO Take 2 each by mouth daily.    . cetirizine (ZYRTEC) 10 MG tablet Take 1 tablet (10 mg total) by mouth 2 (two) times daily. 30 tablet 5  . famotidine (PEPCID) 20 MG tablet Take 1 tablet (20 mg total) by mouth 2 (two) times daily. 60 tablet 1  . fluticasone (FLOVENT HFA) 110 MCG/ACT inhaler Inhale 1 puff into the lungs in the morning and at bedtime. (Patient taking differently: Inhale 1 puff into the lungs 2 (two) times daily as needed (asthma).) 1 each 0  . gabapentin (NEURONTIN) 100 MG capsule Take 100 mg by mouth at bedtime.    Marland Kitchen ipratropium (ATROVENT) 0.03 % nasal spray Place 2 sprays into both nostrils 3 (three) times daily. 30 mL 1  .  levonorgestrel (MIRENA) 20 MCG/24HR IUD 1 each by Intrauterine route once.    . meclizine (ANTIVERT) 25 MG tablet Take 1 tablet (25 mg total) by mouth 3 (three) times daily as needed for  dizziness. 30 tablet 0  . metoCLOPramide (REGLAN) 10 MG tablet Take 1 tablet (10 mg total) by mouth every 6 (six) hours as needed for nausea. 30 tablet 1  . ondansetron (ZOFRAN) 4 MG tablet Take 1 tablet (4 mg total) by mouth every 8 (eight) hours as needed for nausea or vomiting. 30 tablet 0  . ondansetron (ZOFRAN) 4 MG tablet Take 1 tablet (4 mg total) by mouth daily as needed for nausea or vomiting. 30 tablet 1  . pantoprazole (PROTONIX) 40 MG tablet Take 1 tablet (40 mg total) by mouth 2 (two) times daily. 90 tablet 5  . scopolamine (TRANSDERM-SCOP, 1.5 MG,) 1 MG/3DAYS Place 1 patch (1.5 mg total) onto the skin every 3 (three) days. 1 patch 0  . simethicone (GAS-X) 80 MG chewable tablet Chew 1 tablet (80 mg total) by mouth 4 (four) times daily as needed for flatulence (bloating, abdominal discomfort). 100 tablet 2  . sucralfate (CARAFATE) 1 GM/10ML suspension Take 10 mLs (1 g total) by mouth 4 (four) times daily. 420 mL 1  . valACYclovir (VALTREX) 500 MG tablet 1 tablet as needed.     No current facility-administered medications for this visit.    Physical Exam:     BP 104/72   Pulse 84   Ht 5\' 4"  (1.626 m)   Wt 166 lb 8 oz (75.5 kg)   BMI 28.58 kg/m   GENERAL:  Pleasant female in NAD PSYCH: : Cooperative, normal affect EENT:  conjunctiva pink, mucous membranes moist, neck supple without masses CARDIAC:  RRR, no murmur heard, no peripheral edema PULM: Normal respiratory effort, lungs CTA bilaterally, no wheezing ABDOMEN:  Nondistended, soft, nontender. No obvious masses, no hepatomegaly,  normal bowel sounds SKIN:  turgor, no lesions seen Musculoskeletal:  Normal muscle tone, normal strength NEURO: Alert and oriented x 3, no focal neurologic deficits   IMPRESSION and PLAN:    1) GERD s/p TIF -Doing well postoperatively.  Advancing diet per postoperative protocol -Expected weight loss with postoperative diet -Reduce Protonix to 40 mg daily x2 weeks, then 20 mg/day x1 week,  then as needed -Continue Pepcid for another week, then as needed -Okay to liberalize activity/exercise  -RTC in 3-4 months or sooner as needed          Lavena Bullion ,DO, FACG 05/04/2020, 8:22 AM

## 2020-05-04 NOTE — Patient Instructions (Addendum)
If you are age 40 or older, your body mass index should be between 23-30. Your Body mass index is 28.58 kg/m. If this is out of the aforementioned range listed, please consider follow up with your Primary Care Provider.  If you are age 71 or younger, your body mass index should be between 19-25. Your Body mass index is 28.58 kg/m. If this is out of the aformentioned range listed, please consider follow up with your Primary Care Provider.   Continue you Protonix 40 mg daily for 2 weeks then discontinue taking it. Continue Pepcid for 1 more week then also discontinue.  Contact the office with any problems. We would like to see you in the [facility] office in 3-4 months. Please call to schedule in June 2022. [phone]   Due to recent changes in healthcare laws, you may see the results of your imaging and laboratory studies on MyChart before your provider has had a chance to review them.  We understand that in some cases there may be results that are confusing or concerning to you. Not all laboratory results come back in the same time frame and the provider may be waiting for multiple results in order to interpret others.  Please give [place] 48 hours in order for your provider to thoroughly review all the results before contacting the office for clarification of your results.   Thank you for choosing me and [facility].  Vito Cirigliano, D.O.

## 2020-06-24 ENCOUNTER — Encounter: Payer: Self-pay | Admitting: Family Medicine

## 2020-08-11 ENCOUNTER — Encounter: Payer: 59 | Admitting: Physician Assistant

## 2020-08-18 ENCOUNTER — Ambulatory Visit (INDEPENDENT_AMBULATORY_CARE_PROVIDER_SITE_OTHER): Payer: 59 | Admitting: Physician Assistant

## 2020-08-18 ENCOUNTER — Other Ambulatory Visit: Payer: Self-pay

## 2020-08-18 ENCOUNTER — Encounter: Payer: Self-pay | Admitting: Physician Assistant

## 2020-08-18 VITALS — BP 104/68 | HR 64 | Temp 98.1°F | Ht 64.0 in | Wt 167.2 lb

## 2020-08-18 DIAGNOSIS — Z800 Family history of malignant neoplasm of digestive organs: Secondary | ICD-10-CM

## 2020-08-18 DIAGNOSIS — K2100 Gastro-esophageal reflux disease with esophagitis, without bleeding: Secondary | ICD-10-CM

## 2020-08-18 DIAGNOSIS — H6993 Unspecified Eustachian tube disorder, bilateral: Secondary | ICD-10-CM | POA: Diagnosis not present

## 2020-08-18 DIAGNOSIS — M797 Fibromyalgia: Secondary | ICD-10-CM

## 2020-08-18 NOTE — Patient Instructions (Signed)
Referral placed for ENT  Call GI to schedule f/up and colonoscopy  Keep up the awesome work with your exercises and stretching!!!

## 2020-08-18 NOTE — Progress Notes (Signed)
Acute Office Visit  Subjective:    Patient ID: Shannon Gordon, female    DOB: 1980-08-17, 40 y.o.   MRN: [id number]  Chief Complaint  Patient presents with   Referral    ENT    HPI Patient is in today for transfer of care from [name].  She would like referral to ENT for chronic ETD.   Prior rheumatology appointment was told likely fibromyalgia. 5-7 days /week exercising at least 30 minutes. This has really helped pain level, as well as Baclofen 5 mg as needed. She does have Gabapentin 100 mg to take as needed as well for low back pain flare-up, but says mostly exercise and stretching helps.   Sees GYN for IUD and pap smears.  Strong family history of colon cancer - she will discuss with GI about referral for getting colonoscopy done.   GERD- pretty well controlled at this time. Rarely needs to use Pepcid, Zofran, Protonix anymore. TIF procedure 04-07-20.   Past Medical History:  Diagnosis Date   Allergy    Anxiety 05/02/2017   We reviewed hydroxyzine as an option for her anxiety, especially since it is manifested as itching and hives as well.   Arthralgia 05/02/2017   Patient has been followed by orthopedics in the past.  She does take Mobic and as needed tramadol.  I am okay with taking his medications over.   Brown recluse spider bite    GERD (gastroesophageal reflux disease)    History of COVID-19 12/2019   PONV (postoperative nausea and vomiting)    Positive ANA (antinuclear antibody) 05/02/2017   Patient already follows with rheumatology.   Urticaria     Past Surgical History:  Procedure Laterality Date   ESOPHAGEAL DILATION  04/07/2020   Procedure: ESOPHAGEAL DILATION;  Surgeon: Lavena Bullion, DO;  Location: [facility];  Service: Gastroenterology;;   ESOPHAGOGASTRODUODENOSCOPY N/A 04/07/2020   Procedure: ESOPHAGOGASTRODUODENOSCOPY (EGD);  Surgeon: Lavena Bullion, DO;  Location: [facility];  Service: Gastroenterology;  Laterality: N/A;   LEG SURGERY Right     right lower leg tissue removal from brown recluse spider bite   TRANSORAL INCISIONLESS FUNDOPLICATION N/A 04/07/4534   Procedure: TRANSORAL INCISIONLESS FUNDOPLICATION;  Surgeon: Lavena Bullion, DO;  Location: [facility];  Service: Gastroenterology;  Laterality: N/A;    Family History  Problem Relation Age of Onset   Thyroid disease Mother    Hypercalcemia Mother    Colon polyps Mother    Hypothyroidism Maternal Grandmother    Colon cancer Maternal Grandmother    Colon cancer Other        mat. great GF, mat great Aunt , mat GF's sister   Esophageal cancer Neg Hx    Rectal cancer Neg Hx    Stomach cancer Neg Hx     Social History   Socioeconomic History   Marital status: Married    Spouse name: Not on file   Number of children: 1   Years of education: 16   Highest education level: Bachelor's degree (e.g., BA, AB, BS)  Occupational History   Occupation: Oceanographer  Tobacco Use   Smoking status: Former    Packs/day: 0.50    Years: 10.00    Pack years: 5.00    Types: Cigarettes    Quit date: 2013    Years since quitting: 9.5   Smokeless tobacco: Never  Vaping Use   Vaping Use: Never used  Substance and Sexual Activity   Alcohol use: Yes  Comment: occ use only    Drug use: Never   Sexual activity: Yes    Birth control/protection: I.U.D.  Other Topics Concern   Not on file  Social History Narrative   Married   1 5/yo child (born in 2015)   Works for a preschool 2 days a week   Social Determinants of Radio broadcast assistant Strain: Not on file  Food Insecurity: Not on file  Transportation Needs: Not on file  Physical Activity: Not on file  Stress: Not on file  Social Connections: Not on file  Intimate Partner Violence: Not on file    Outpatient Medications Prior to Visit  Medication Sig Dispense Refill   baclofen (LIORESAL) 20 MG tablet Take 5 mg by mouth daily as needed for muscle spasms.     BLACK ELDERBERRY PO Take 2 each by  mouth daily.     cetirizine (ZYRTEC) 10 MG tablet Take 1 tablet (10 mg total) by mouth 2 (two) times daily. 30 tablet 5   famotidine (PEPCID) 20 MG tablet Take 1 tablet (20 mg total) by mouth 2 (two) times daily. (Patient taking differently: Take 20 mg by mouth daily.) 60 tablet 1   gabapentin (NEURONTIN) 100 MG capsule Take 100 mg by mouth as needed.     ipratropium (ATROVENT) 0.03 % nasal spray Place 2 sprays into both nostrils 3 (three) times daily. 30 mL 1   levonorgestrel (MIRENA) 20 MCG/24HR IUD 1 each by Intrauterine route once.     meclizine (ANTIVERT) 25 MG tablet Take 1 tablet (25 mg total) by mouth 3 (three) times daily as needed for dizziness. (Patient taking differently: Take 25 mg by mouth as needed for dizziness.) 30 tablet 0   metoCLOPramide (REGLAN) 10 MG tablet Take 1 tablet (10 mg total) by mouth every 6 (six) hours as needed for nausea. 30 tablet 1   ondansetron (ZOFRAN) 4 MG tablet Take 1 tablet (4 mg total) by mouth daily as needed for nausea or vomiting. 30 tablet 1   pantoprazole (PROTONIX) 40 MG tablet Take 1 tablet (40 mg total) by mouth 2 (two) times daily. 90 tablet 5   simethicone (GAS-X) 80 MG chewable tablet Chew 1 tablet (80 mg total) by mouth 4 (four) times daily as needed for flatulence (bloating, abdominal discomfort). 100 tablet 2   valACYclovir (VALTREX) 500 MG tablet 1 tablet as needed.     fluticasone (FLOVENT HFA) 110 MCG/ACT inhaler Inhale 1 puff into the lungs in the morning and at bedtime. (Patient taking differently: Inhale 1 puff into the lungs as needed (asthma).) 1 each 0   No facility-administered medications prior to visit.    No Known Allergies  Review of Systems REFER TO HPI FOR PERTINENT POSITIVES AND NEGATIVES     Objective:    Physical Exam Vitals and nursing note reviewed.  Constitutional:      Appearance: Normal appearance. She is normal weight. She is not toxic-appearing.  HENT:     Head: Normocephalic and atraumatic.     Right  Ear: Ear canal and external ear normal. A middle ear effusion is present.     Left Ear: Ear canal and external ear normal. A middle ear effusion is present.     Nose: Nose normal.     Mouth/Throat:     Mouth: Mucous membranes are moist.  Eyes:     Extraocular Movements: Extraocular movements intact.     Conjunctiva/sclera: Conjunctivae normal.     Pupils: Pupils are equal, round,  and reactive to light.  Cardiovascular:     Rate and Rhythm: Normal rate and regular rhythm.     Pulses: Normal pulses.     Heart sounds: Normal heart sounds.  Pulmonary:     Effort: Pulmonary effort is normal.     Breath sounds: Normal breath sounds.  Abdominal:     General: Abdomen is flat. Bowel sounds are normal.     Palpations: Abdomen is soft.  Musculoskeletal:        General: Normal range of motion.     Cervical back: Normal range of motion and neck supple.  Skin:    General: Skin is warm and dry.  Neurological:     General: No focal deficit present.     Mental Status: She is alert and oriented to person, place, and time.  Psychiatric:        Mood and Affect: Mood normal.        Behavior: Behavior normal.        Thought Content: Thought content normal.        Judgment: Judgment normal.    BP 104/68   Pulse 64   Temp 98.1 F (36.7 C)   Ht 5\' 4"  (1.626 m)   Wt 167 lb 3.2 oz (75.8 kg)   SpO2 97%   BMI 28.70 kg/m  Wt Readings from Last 3 Encounters:  08/18/20 167 lb 3.2 oz (75.8 kg)  05/04/20 166 lb 8 oz (75.5 kg)  04/07/20 180 lb (81.6 kg)    Health Maintenance Due  Topic Date Due   Hepatitis C Screening  Never done   PAP SMEAR-Modifier  04/23/2020    There are no preventive care reminders to display for this patient.   Lab Results  Component Value Date   TSH 1.41 12/01/2019   Lab Results  Component Value Date   WBC 5.5 12/01/2019   HGB 14.1 12/01/2019   HCT 41.9 12/01/2019   MCV 92.9 12/01/2019   PLT 257 12/01/2019   Lab Results  Component Value Date   NA 140  12/01/2019   K 4.0 12/01/2019   CO2 30 12/01/2019   GLUCOSE 70 12/01/2019   BUN 12 12/01/2019   CREATININE 0.73 12/01/2019   BILITOT 0.4 12/01/2019   ALKPHOS 51 04/04/2019   AST 16 12/01/2019   ALT 9 12/01/2019   PROT 6.8 12/01/2019   ALBUMIN 4.4 04/04/2019   CALCIUM 10.0 12/01/2019   GFR 84.82 04/04/2019   Lab Results  Component Value Date   CHOL 208 (H) 04/04/2019   Lab Results  Component Value Date   HDL 65.20 04/04/2019   Lab Results  Component Value Date   LDLCALC 132 (H) 04/04/2019   Lab Results  Component Value Date   TRIG 53.0 04/04/2019   Lab Results  Component Value Date   CHOLHDL 3 04/04/2019   Lab Results  Component Value Date   HGBA1C 5.4 06/20/2018       Assessment & Plan:   Problem List Items Addressed This Visit       Digestive   GERD with esophagitis     Other   Fibromyalgia   Other Visit Diagnoses     Disorder of Eustachian tube, bilateral    -  Primary   Relevant Orders   Ambulatory referral to ENT   Family history of colon cancer           1. Disorder of Eustachian tube, bilateral Referral to Dr. Benjamine Mola per patient request. No acute issues  today.  2. Fibromyalgia Stable. Doing well with exercise and stretching. Taking Flexeril 5 mg as needed, which also helps. She does not feel like she needs Cymbalta at this time.  3. Gastroesophageal reflux disease with esophagitis without hemorrhage 4. Family history of colon cancer She is following up with GI and will get referral for colonoscopy from them. GERD is well controlled at this time.  Total encounter time with patient today including face to face, lab and record review, plan and dictation was 32 minutes.    Leonardo Plaia M Ellery Meroney, PA-C

## 2020-08-26 ENCOUNTER — Encounter: Payer: Self-pay | Admitting: Physician Assistant

## 2020-08-30 NOTE — Telephone Encounter (Signed)
Pt called about daughter's referral.

## 2020-11-29 ENCOUNTER — Encounter: Payer: Self-pay | Admitting: Physician Assistant

## 2020-12-17 ENCOUNTER — Ambulatory Visit: Payer: 59 | Admitting: Gastroenterology

## 2020-12-30 ENCOUNTER — Other Ambulatory Visit: Payer: Self-pay | Admitting: Obstetrics and Gynecology

## 2020-12-30 DIAGNOSIS — Z1231 Encounter for screening mammogram for malignant neoplasm of breast: Secondary | ICD-10-CM

## 2021-01-31 ENCOUNTER — Other Ambulatory Visit: Payer: Self-pay | Admitting: Physician Assistant

## 2021-02-01 MED ORDER — BACLOFEN 20 MG PO TABS: 20.0000 mg | ORAL_TABLET | Freq: Every day | ORAL | 0 refills | Status: DC | PRN

## 2021-02-02 ENCOUNTER — Ambulatory Visit (INDEPENDENT_AMBULATORY_CARE_PROVIDER_SITE_OTHER): Payer: 59 | Admitting: Gastroenterology

## 2021-02-02 ENCOUNTER — Encounter: Payer: Self-pay | Admitting: Gastroenterology

## 2021-02-02 VITALS — BP 110/70 | HR 76 | Ht 64.0 in | Wt 181.0 lb

## 2021-02-02 DIAGNOSIS — Z800 Family history of malignant neoplasm of digestive organs: Secondary | ICD-10-CM | POA: Diagnosis not present

## 2021-02-02 DIAGNOSIS — Z8371 Family history of colonic polyps: Secondary | ICD-10-CM | POA: Diagnosis not present

## 2021-02-02 MED ORDER — PLENVU 140 G PO SOLR: 1.0000 | Freq: Once | ORAL | 0 refills | Status: AC

## 2021-02-02 MED ORDER — SUTAB 1479-225-188 MG PO TABS: 1.0000 | ORAL_TABLET | Freq: Once | ORAL | 0 refills | Status: AC

## 2021-02-02 NOTE — Progress Notes (Signed)
Referring Provider: Allwardt, Randa Evens, PA-C Primary Care Physician:  Allwardt, Randa Evens, PA-C  Reason for Consultation: I need a colonoscopy   IMPRESSION:  Family history of colon polyps (mother in her 10s, brother at age 41) Family history of colon cancer (maternal grandmother with colon cancer in her 81s)  Given her family history, colonoscopy recommended starting at age 9.    PLAN: Colonoscopy for colon cancer screening  I consented the patient discussing the risks, benefits, and alternatives to endoscopic evaluation. In particular, we discussed the risks that include, but are not limited to, reaction to medication, cardiopulmonary compromise, bleeding requiring blood transfusion, aspiration resulting in pneumonia, perforation requiring surgery, lack of diagnosis, severe illness requiring hospitalization, and even death. We reviewed the risk of missed lesion including polyps or even cancer. The patient acknowledges these risks and asks that we proceed.   Please see the "Patient Instructions" section for addition details about the plan.  HPI: Shannon Gordon is a 41 y.o. female who presents today to discuss colon cancer screening.  She was last seen in 2021 for reflux not responding to daily PPI therapy.  She has done well since her TIF 04/08/20. She is now using PPI and H2B on a PRN basis. She has successfully identified food triggers that she avoids.    Returns today wishing to discuss colon cancer screening.  Multiple family members have had colon cancer including maternal grandmother, maternal great grandmother, maternal great aunt, maternal grandfather sister. Her mother has had precancerous polyps requiring serial colonoscopies. Her 25 year old brother just had precancerous polyps removed on his first colonoscopy.  She has no altered bowel habits or blood in the stool.   Past Medical History:  Diagnosis Date   Allergy    Anxiety 05/02/2017   We reviewed hydroxyzine as an  option for her anxiety, especially since it is manifested as itching and hives as well.   Arthralgia 05/02/2017   Patient has been followed by orthopedics in the past.  She does take Mobic and as needed tramadol.  I am okay with taking his medications over.   Brown recluse spider bite    GERD (gastroesophageal reflux disease)    History of COVID-19 12/2019   PONV (postoperative nausea and vomiting)    Positive ANA (antinuclear antibody) 05/02/2017   Patient already follows with rheumatology.   Urticaria     Past Surgical History:  Procedure Laterality Date   ESOPHAGEAL DILATION  04/07/2020   Procedure: ESOPHAGEAL DILATION;  Surgeon: Lavena Bullion, DO;  Location: [facility];  Service: Gastroenterology;;   ESOPHAGOGASTRODUODENOSCOPY N/A 04/07/2020   Procedure: ESOPHAGOGASTRODUODENOSCOPY (EGD);  Surgeon: Lavena Bullion, DO;  Location: [facility];  Service: Gastroenterology;  Laterality: N/A;   LEG SURGERY Right    right lower leg tissue removal from brown recluse spider bite   TRANSORAL INCISIONLESS FUNDOPLICATION N/A 10/04/6385   Procedure: TRANSORAL INCISIONLESS FUNDOPLICATION;  Surgeon: Lavena Bullion, DO;  Location: [facility];  Service: Gastroenterology;  Laterality: N/A;    Current Outpatient Medications  Medication Sig Dispense Refill   baclofen (LIORESAL) 20 MG tablet Take 1 tablet (20 mg total) by mouth daily as needed for muscle spasms. 30 each 0   BLACK ELDERBERRY PO Take 2 each by mouth daily.     cetirizine (ZYRTEC) 10 MG tablet Take 1 tablet (10 mg total) by mouth 2 (two) times daily. 30 tablet 5   famotidine (PEPCID) 20 MG tablet Take 1 tablet (20 mg total) by mouth 2 (  two) times daily. (Patient taking differently: Take 20 mg by mouth daily.) 60 tablet 1   gabapentin (NEURONTIN) 100 MG capsule Take 100 mg by mouth as needed.     ipratropium (ATROVENT) 0.03 % nasal spray Place 2 sprays into both nostrils 3 (three) times daily. 30 mL 1   levonorgestrel (MIRENA) 20  MCG/24HR IUD 1 each by Intrauterine route once.     meclizine (ANTIVERT) 25 MG tablet Take 1 tablet (25 mg total) by mouth 3 (three) times daily as needed for dizziness. (Patient taking differently: Take 25 mg by mouth as needed for dizziness.) 30 tablet 0   metoCLOPramide (REGLAN) 10 MG tablet Take 1 tablet (10 mg total) by mouth every 6 (six) hours as needed for nausea. 30 tablet 1   ondansetron (ZOFRAN) 4 MG tablet Take 1 tablet (4 mg total) by mouth daily as needed for nausea or vomiting. 30 tablet 1   pantoprazole (PROTONIX) 40 MG tablet Take 1 tablet (40 mg total) by mouth 2 (two) times daily. (Patient taking differently: Take 40 mg by mouth 2 (two) times daily as needed.) 90 tablet 5   valACYclovir (VALTREX) 500 MG tablet 1 tablet as needed.     No current facility-administered medications for this visit.    Allergies as of 02/02/2021   (No Known Allergies)    Family History  Problem Relation Age of Onset   Thyroid disease Mother    Hypercalcemia Mother    Colon polyps Mother    Colon polyps Brother 102   Hypothyroidism Maternal Grandmother    Colon cancer Maternal Grandmother    Colon cancer Other        mat. great GF, mat great Aunt , mat GF's sister   Esophageal cancer Neg Hx    Rectal cancer Neg Hx    Stomach cancer Neg Hx      Physical Exam: General:   Alert,  well-nourished, pleasant and cooperative in NAD Abdomen:  Soft, nontender, nondistended, normal bowel sounds, no rebound or guarding. No hepatosplenomegaly.   Extremities:  No clubbing or edema. Neurologic:  Alert and  oriented x4;  grossly nonfocal Skin:  Intact without significant lesions or rashes. Psych:  Alert and cooperative. Normal mood and affect.   Finesse Fielder L. Tarri Glenn, MD, MPH 02/03/2021, 2:51 PM

## 2021-02-02 NOTE — Patient Instructions (Addendum)
If you are age 41 or older, your body mass index should be between 23-30. Your Body mass index is 31.07 kg/m. If this is out of the aforementioned range listed, please consider follow up with your Primary Care Provider.  If you are age 22 or younger, your body mass index should be between 19-25. Your Body mass index is 31.07 kg/m. If this is out of the aformentioned range listed, please consider follow up with your Primary Care Provider.   ________________________________________________________  The [place] [facility] providers would like to encourage you to use [facility] to communicate with providers for non-urgent requests or questions.  Due to long hold times on the telephone, sending your provider a message by [facility] may be a faster and more efficient way to get a response.  Please allow 48 business hours for a response.  Please remember that this is for non-urgent requests.  _______________________________________________________  Shannon Gordon have been scheduled for a colonoscopy. Please follow written instructions given to you at your visit today.  Please pick up your prep supplies at the pharmacy within the next 1-3 days. If you use inhalers (even only as needed), please bring them with you on the day of your procedure.   TIps for colonoscopy:  - Stay well hydrated for 3-4 days prior to the exam - especially the morning prior to the procedure. This reduces nausea and dehydration.  - To prevent skin/hemorrhoid irritation - prior to wiping, put A&Dointment or vaseline on the toilet paper. - Keep a towel or pad on the bed.  - Drink  64oz of clear liquids in the morning of prep day (prior to starting the prep) to be sure that there is enough fluid to flush the colon and stay hydrated!!!! This is in addition to the fluids required for preparation. - Use of a flavored hard candy, such as grape Shannon Gordon, can counteract some of the flavor of the prep and may prevent some nausea.

## 2021-02-03 ENCOUNTER — Ambulatory Visit
Admission: RE | Admit: 2021-02-03 | Discharge: 2021-02-03 | Disposition: A | Discharge status: Home / Self Care | Payer: 59 | Source: Ambulatory Visit | Attending: Obstetrics and Gynecology | Admitting: Obstetrics and Gynecology

## 2021-02-03 ENCOUNTER — Encounter: Payer: Self-pay | Admitting: Gastroenterology

## 2021-02-03 DIAGNOSIS — Z1231 Encounter for screening mammogram for malignant neoplasm of breast: Secondary | ICD-10-CM

## 2021-02-04 ENCOUNTER — Other Ambulatory Visit: Payer: Self-pay | Admitting: Obstetrics and Gynecology

## 2021-02-04 DIAGNOSIS — R928 Other abnormal and inconclusive findings on diagnostic imaging of breast: Secondary | ICD-10-CM

## 2021-02-17 ENCOUNTER — Encounter: Payer: Self-pay | Admitting: Physician Assistant

## 2021-03-08 ENCOUNTER — Ambulatory Visit
Admission: RE | Admit: 2021-03-08 | Discharge: 2021-03-08 | Disposition: A | Discharge status: Home / Self Care | Payer: 59 | Source: Ambulatory Visit | Attending: Obstetrics and Gynecology | Admitting: Obstetrics and Gynecology

## 2021-03-08 DIAGNOSIS — R928 Other abnormal and inconclusive findings on diagnostic imaging of breast: Secondary | ICD-10-CM

## 2021-03-23 ENCOUNTER — Encounter: Payer: Self-pay | Admitting: Gastroenterology

## 2021-03-29 ENCOUNTER — Encounter: Payer: Self-pay | Admitting: Gastroenterology

## 2021-03-29 ENCOUNTER — Ambulatory Visit (AMBULATORY_SURGERY_CENTER): Payer: 59 | Admitting: Gastroenterology

## 2021-03-29 ENCOUNTER — Other Ambulatory Visit: Payer: Self-pay

## 2021-03-29 VITALS — BP 104/65 | HR 60 | Temp 97.3°F | Resp 17 | Ht 64.0 in | Wt 181.0 lb

## 2021-03-29 DIAGNOSIS — D122 Benign neoplasm of ascending colon: Secondary | ICD-10-CM | POA: Diagnosis not present

## 2021-03-29 DIAGNOSIS — D123 Benign neoplasm of transverse colon: Secondary | ICD-10-CM | POA: Diagnosis not present

## 2021-03-29 DIAGNOSIS — Z1211 Encounter for screening for malignant neoplasm of colon: Secondary | ICD-10-CM

## 2021-03-29 DIAGNOSIS — Z800 Family history of malignant neoplasm of digestive organs: Secondary | ICD-10-CM

## 2021-03-29 DIAGNOSIS — Z8371 Family history of colonic polyps: Secondary | ICD-10-CM

## 2021-03-29 HISTORY — PX: COLONOSCOPY: SHX174

## 2021-03-29 MED ORDER — SODIUM CHLORIDE 0.9 % IV SOLN: 500.0000 mL | Freq: Once | INTRAVENOUS | Status: DC

## 2021-03-29 NOTE — Patient Instructions (Signed)
Information on polyps given toyou today.  Await pathology results.    Resume previous diet and medications.  Repeat colonoscopy in 5 years for surveillance given family history.   YOU HAD AN ENDOSCOPIC PROCEDURE TODAY AT [facility]:   Refer to the procedure report that was given to you for any specific questions about what was found during the examination.  If the procedure report does not answer your questions, please call your gastroenterologist to clarify.  If you requested that your care partner not be given the details of your procedure findings, then the procedure report has been included in a sealed envelope for you to review at your convenience later.  YOU SHOULD EXPECT: Some feelings of bloating in the abdomen. Passage of more gas than usual.  Walking can help get rid of the air that was put into your GI tract during the procedure and reduce the bloating. If you had a lower endoscopy (such as a colonoscopy or flexible sigmoidoscopy) you may notice spotting of blood in your stool or on the toilet paper. If you underwent a bowel prep for your procedure, you may not have a normal bowel movement for a few days.  Please Note:  You might notice some irritation and congestion in your nose or some drainage.  This is from the oxygen used during your procedure.  There is no need for concern and it should clear up in a day or so.  SYMPTOMS TO REPORT IMMEDIATELY:  Following lower endoscopy (colonoscopy or flexible sigmoidoscopy):  Excessive amounts of blood in the stool  Significant tenderness or worsening of abdominal pains  Swelling of the abdomen that is new, acute  Fever of 100F or higher   For urgent or emergent issues, a gastroenterologist can be reached at any hour by calling [phone]. Do not use MyChart messaging for urgent concerns.    DIET:  We do recommend a small meal at first, but then you may proceed to your regular diet.  Drink plenty of fluids but you  should avoid alcoholic beverages for 24 hours.  ACTIVITY:  You should plan to take it easy for the rest of today and you should NOT DRIVE or use heavy machinery until tomorrow (because of the sedation medicines used during the test).    FOLLOW UP: Our staff will call the number listed on your records 48-72 hours following your procedure to check on you and address any questions or concerns that you may have regarding the information given to you following your procedure. If we do not reach you, we will leave a message.  We will attempt to reach you two times.  During this call, we will ask if you have developed any symptoms of COVID 19. If you develop any symptoms (ie: fever, flu-like symptoms, shortness of breath, cough etc.) before then, please call [phone].  If you test positive for Covid 19 in the 2 weeks post procedure, please call and report this information to [place].    If any biopsies were taken you will be contacted by phone or by letter within the next 1-3 weeks.  Please call us at [phone] if you have not heard about the biopsies in 3 weeks.    SIGNATURES/CONFIDENTIALITY: You and/or your care partner have signed paperwork which will be entered into your electronic medical record.  These signatures attest to the fact that that the information above on your After Visit Summary has been reviewed and is understood.  Full responsibility of  the confidentiality of this discharge information lies with you and/or your care-partner.

## 2021-03-29 NOTE — Op Note (Signed)
[facility] Patient Name: Shannon Gordon Procedure Date: 03/29/2021 1:33 PM MRN: [id number] Endoscopist: Thornton Park MD, MD Age: 41 Referring MD:  Date of Birth: 1980-10-11 Gender: Female Account #: [id number] Procedure:                Colonoscopy Indications:              Colon cancer screening in patient at increased                            risk: Family history of colon polyps in multiple                            1st-degree relatives                           Family history of colon polyps (mother in her 30s,                            brother at age 24)                           Family history of colon cancer (maternal                            grandmother in her 90s) Medicines:                Monitored Anesthesia Care Procedure:                Pre-Anesthesia Assessment:                           - Prior to the procedure, a History and Physical                            was performed, and patient medications and                            allergies were reviewed. The patient's tolerance of                            previous anesthesia was also reviewed. The risks                            and benefits of the procedure and the sedation                            options and risks were discussed with the patient.                            All questions were answered, and informed consent                            was obtained. Prior Anticoagulants: The patient has  taken no previous anticoagulant or antiplatelet                            agents. ASA Grade Assessment: II - A patient with                            mild systemic disease. After reviewing the risks                            and benefits, the patient was deemed in                            satisfactory condition to undergo the procedure.                           After obtaining informed consent, the colonoscope                            was passed under direct  vision. Throughout the                            procedure, the patient's blood pressure, pulse, and                            oxygen saturations were monitored continuously. The                            CF [id number] #[id number] was introduced through the anus                            and advanced to the 2 cm into the ileum. A second                            forward view of the right colon was performed. The                            colonoscopy was performed without difficulty. The                            patient tolerated the procedure well. The quality                            of the bowel preparation was excellent. The                            terminal ileum, ileocecal valve, appendiceal                            orifice, and rectum were photographed. Scope In: 1:36:58 PM Scope Out: 1:47:54 PM Scope Withdrawal Time: 0 hours 8 minutes 19 seconds  Total Procedure Duration: 0 hours 10 minutes 56 seconds  Findings:                 The perianal and digital  rectal examinations were                            normal.                           A 1 mm polyp was found in the transverse colon. The                            polyp was flat. The polyp was removed with a                            piecemeal technique using a cold biopsy forceps.                            Resection and retrieval were complete. Estimated                            blood loss was minimal.                           The exam was otherwise without abnormality on                            direct and retroflexion views. Complications:            No immediate complications. Estimated blood loss:                            Minimal. Estimated Blood Loss:     Estimated blood loss was minimal. Impression:               - One 1 mm polyp in the transverse colon, removed                            piecemeal using a cold biopsy forceps. Resected and                            retrieved.                           -  The examination was otherwise normal on direct                            and retroflexion views. Recommendation:           - Patient has a contact number available for                            emergencies. The signs and symptoms of potential                            delayed complications were discussed with the                            patient. Return to normal activities tomorrow.  Written discharge instructions were provided to the                            patient.                           - Resume previous diet.                           - Continue present medications.                           - Await pathology results.                           - Repeat colonoscopy in 5 years for surveillance                            regardless of pathology results given the family                            history.                           - Emerging evidence supports eating a diet of                            fruits, vegetables, grains, calcium, and yogurt                            while reducing red meat and alcohol may reduce the                            risk of colon cancer.                           - Thank you for allowing me to be involved in your                            colon cancer prevention. Thornton Park MD, MD 03/29/2021 1:52:51 PM This report has been signed electronically.

## 2021-03-29 NOTE — Progress Notes (Signed)
Referring Provider: Allwardt, Randa Evens, PA-C Primary Care Physician:  Allwardt, Alyssa M, PA-C    INDICATION FOR PROCEDURE: Family history of colon polyps (mother in her 71s, brother at age 41) Family history of colon cancer (maternal grandmother with colon cancer in her 35s)  Given her family history, colonoscopy recommended starting at age 38.    PLAN: Colonoscopy for colon cancer screening She is an appropriate candidate for monitored anesthesia care in the [place] [facility]   HPI: Shannon Gordon is a 41 y.o. female who presents for colon cancer screening.  Multiple family members have had colon cancer including maternal grandmother, maternal great grandmother, maternal great aunt, maternal grandfather sister. Her mother has had precancerous polyps requiring serial colonoscopies. Her 34 year old brother just had precancerous polyps removed on his first colonoscopy.  She has no altered bowel habits or blood in the stool.   Past Medical History:  Diagnosis Date   Allergy    Anxiety 05/02/2017   We reviewed hydroxyzine as an option for her anxiety, especially since it is manifested as itching and hives as well.   Arthralgia 05/02/2017   Patient has been followed by orthopedics in the past.  She does take Mobic and as needed tramadol.  I am okay with taking his medications over.   Brown recluse spider bite    GERD (gastroesophageal reflux disease)    History of COVID-19 12/2019   PONV (postoperative nausea and vomiting)    Positive ANA (antinuclear antibody) 05/02/2017   Patient already follows with rheumatology.   Urticaria     Past Surgical History:  Procedure Laterality Date   COLONOSCOPY  03/29/2021   ESOPHAGEAL DILATION  04/07/2020   Procedure: ESOPHAGEAL DILATION;  Surgeon: Lavena Bullion, DO;  Location: [facility];  Service: Gastroenterology;;   ESOPHAGOGASTRODUODENOSCOPY N/A 04/07/2020   Procedure: ESOPHAGOGASTRODUODENOSCOPY (EGD);  Surgeon: Lavena Bullion, DO;  Location:  [facility];  Service: Gastroenterology;  Laterality: N/A;   LEG SURGERY Right    right lower leg tissue removal from brown recluse spider bite   TRANSORAL INCISIONLESS FUNDOPLICATION N/A 36/64/4034   Procedure: TRANSORAL INCISIONLESS FUNDOPLICATION;  Surgeon: Lavena Bullion, DO;  Location: [facility];  Service: Gastroenterology;  Laterality: N/A;    Current Outpatient Medications  Medication Sig Dispense Refill   baclofen (LIORESAL) 20 MG tablet Take 1 tablet (20 mg total) by mouth daily as needed for muscle spasms. 30 each 0   BLACK ELDERBERRY PO Take 2 each by mouth daily.     cetirizine (ZYRTEC) 10 MG tablet Take 1 tablet (10 mg total) by mouth 2 (two) times daily. 30 tablet 5   famotidine (PEPCID) 20 MG tablet Take 1 tablet (20 mg total) by mouth 2 (two) times daily. (Patient taking differently: Take 20 mg by mouth daily.) 60 tablet 1   levonorgestrel (MIRENA) 20 MCG/24HR IUD 1 each by Intrauterine route once.     ondansetron (ZOFRAN) 4 MG tablet Take 1 tablet (4 mg total) by mouth daily as needed for nausea or vomiting. 30 tablet 1   pantoprazole (PROTONIX) 40 MG tablet Take 1 tablet (40 mg total) by mouth 2 (two) times daily. (Patient taking differently: Take 40 mg by mouth 2 (two) times daily as needed.) 90 tablet 5   gabapentin (NEURONTIN) 100 MG capsule Take 100 mg by mouth as needed.     ipratropium (ATROVENT) 0.03 % nasal spray Place 2 sprays into both nostrils 3 (three) times daily. 30 mL 1   meclizine (ANTIVERT) 25 MG  tablet Take 1 tablet (25 mg total) by mouth 3 (three) times daily as needed for dizziness. (Patient taking differently: Take 25 mg by mouth as needed for dizziness.) 30 tablet 0   metoCLOPramide (REGLAN) 10 MG tablet Take 1 tablet (10 mg total) by mouth every 6 (six) hours as needed for nausea. 30 tablet 1   valACYclovir (VALTREX) 500 MG tablet 1 tablet as needed.     Current Facility-Administered Medications  Medication Dose Route Frequency Provider Last  Rate Last Admin   0.9 %  sodium chloride infusion  500 mL Intravenous Once Thornton Park, MD        Allergies as of 03/29/2021   (No Known Allergies)    Family History  Problem Relation Age of Onset   Thyroid disease Mother    Hypercalcemia Mother    Colon polyps Mother    Colon polyps Brother 60   Hypothyroidism Maternal Grandmother    Colon cancer Maternal Grandmother    Colon cancer Other        mat. great GF, mat great Aunt , mat GF's sister   Esophageal cancer Neg Hx    Rectal cancer Neg Hx    Stomach cancer Neg Hx      Physical Exam: General:   Alert,  well-nourished, pleasant and cooperative in NAD Abdomen:  Soft, nontender, nondistended, normal bowel sounds, no rebound or guarding. No hepatosplenomegaly.   Extremities:  No clubbing or edema. Neurologic:  Alert and  oriented x4;  grossly nonfocal Skin:  Intact without significant lesions or rashes. Psych:  Alert and cooperative. Normal mood and affect.   Nahum Sherrer L. Tarri Glenn, MD, MPH 03/29/2021, 1:32 PM

## 2021-03-29 NOTE — Progress Notes (Signed)
Called to room to assist during endoscopic procedure.  Patient ID and intended procedure confirmed with present staff. Received instructions for my participation in the procedure from the performing physician.  

## 2021-03-29 NOTE — Progress Notes (Signed)
PT taken to PACU. Monitors in place. VSS. Report given to RN. 

## 2021-03-31 ENCOUNTER — Telehealth: Payer: Self-pay | Admitting: *Deleted

## 2021-03-31 NOTE — Telephone Encounter (Signed)
?  Follow up Call- ? ?Call back number 03/29/2021 06/20/2019  ?Post procedure Call Back phone  # [phone] cell  ?Permission to leave phone message Yes Yes  ?Some recent data might be hidden  ? LMOM ?

## 2021-03-31 NOTE — Telephone Encounter (Signed)
?  Follow up Call- ? ?Call back number 03/29/2021 06/20/2019  ?Post procedure Call Back phone  # [phone] cell  ?Permission to leave phone message Yes Yes  ?Some recent data might be hidden  ?  ? ?Patient questions: ? ?Do you have a fever, pain , or abdominal swelling? No. ?Pain Score  0 * ? ?Have you tolerated food without any problems? Yes.   ? ?Have you been able to return to your normal activities? Yes.   ? ?Do you have any questions about your discharge instructions: ?Diet   No. ?Medications  No. ?Follow up visit  No. ? ?Do you have questions or concerns about your Care? No. ? ?Actions: ?* If pain score is 4 or above: ?No action needed, pain <4. ? ?Have you developed a fever since your procedure? no ? ?2.   Have you had an respiratory symptoms (SOB or cough) since your procedure? no ? ?3.   Have you tested positive for COVID 19 since your procedure no ? ?4.   Have you had any family members/close contacts diagnosed with the COVID 19 since your procedure?  no ? ? ?If yes to any of these questions please route to Joylene John, RN and Joella Prince, RN ? ? ? ?

## 2021-04-07 ENCOUNTER — Encounter: Payer: Self-pay | Admitting: Gastroenterology

## 2021-04-24 ENCOUNTER — Encounter: Payer: Self-pay | Admitting: Physician Assistant

## 2021-04-25 ENCOUNTER — Encounter: Payer: Self-pay | Admitting: Physician Assistant

## 2021-04-25 NOTE — Telephone Encounter (Signed)
V.V scheduled today.

## 2021-04-28 ENCOUNTER — Encounter: Payer: Self-pay | Admitting: Physician Assistant

## 2021-04-29 ENCOUNTER — Other Ambulatory Visit: Payer: Self-pay | Admitting: Physician Assistant

## 2021-04-29 MED ORDER — CEPHALEXIN 500 MG PO CAPS: 1000.0000 mg | ORAL_CAPSULE | Freq: Two times a day (BID) | ORAL | 0 refills | Status: AC

## 2021-08-03 ENCOUNTER — Other Ambulatory Visit: Payer: Self-pay | Admitting: Physician Assistant

## 2021-08-03 ENCOUNTER — Encounter: Payer: Self-pay | Admitting: Physician Assistant

## 2021-08-03 ENCOUNTER — Other Ambulatory Visit: Payer: Self-pay

## 2021-08-03 MED ORDER — BACLOFEN 20 MG PO TABS: 20.0000 mg | ORAL_TABLET | Freq: Every day | ORAL | 0 refills | Status: DC | PRN

## 2021-08-11 ENCOUNTER — Encounter: Payer: Self-pay | Admitting: Physician Assistant

## 2021-08-11 ENCOUNTER — Ambulatory Visit (INDEPENDENT_AMBULATORY_CARE_PROVIDER_SITE_OTHER): Payer: 59 | Admitting: Physician Assistant

## 2021-08-11 VITALS — BP 112/74 | HR 73 | Temp 98.3°F | Ht 64.0 in | Wt 162.8 lb

## 2021-08-11 DIAGNOSIS — Z0000 Encounter for general adult medical examination without abnormal findings: Secondary | ICD-10-CM

## 2021-08-11 LAB — CBC WITH DIFFERENTIAL/PLATELET
Basophils Absolute: 0 10*3/uL (ref 0.0–0.1)
Basophils Relative: 0.7 % (ref 0.0–3.0)
Eosinophils Absolute: 0 10*3/uL (ref 0.0–0.7)
Eosinophils Relative: 0.9 % (ref 0.0–5.0)
HCT: 40.9 % (ref 36.0–46.0)
Hemoglobin: 13.9 g/dL (ref 12.0–15.0)
Lymphocytes Relative: 33.5 % (ref 12.0–46.0)
Lymphs Abs: 1.8 10*3/uL (ref 0.7–4.0)
MCHC: 34.1 g/dL (ref 30.0–36.0)
MCV: 93.4 fl (ref 78.0–100.0)
Monocytes Absolute: 0.3 10*3/uL (ref 0.1–1.0)
Monocytes Relative: 6.4 % (ref 3.0–12.0)
Neutro Abs: 3.1 10*3/uL (ref 1.4–7.7)
Neutrophils Relative %: 58.5 % (ref 43.0–77.0)
Platelets: 235 10*3/uL (ref 150.0–400.0)
RBC: 4.38 Mil/uL (ref 3.87–5.11)
RDW: 13.5 % (ref 11.5–15.5)
WBC: 5.3 10*3/uL (ref 4.0–10.5)

## 2021-08-11 LAB — LIPID PANEL
Cholesterol: 201 mg/dL — ABNORMAL HIGH (ref 0–200)
HDL: 63.4 mg/dL (ref >39.00)
LDL Cholesterol: 127 mg/dL — ABNORMAL HIGH (ref 0–99)
NonHDL: 137.36
Total CHOL/HDL Ratio: 3
Triglycerides: 52 mg/dL (ref 0.0–149.0)
VLDL: 10.4 mg/dL (ref 0.0–40.0)

## 2021-08-11 LAB — COMPREHENSIVE METABOLIC PANEL
ALT: 9 U/L (ref 0–35)
AST: 17 U/L (ref 0–37)
Albumin: 4.7 g/dL (ref 3.5–5.2)
Alkaline Phosphatase: 62 U/L (ref 39–117)
BUN: 17 mg/dL (ref 6–23)
CO2: 26 mEq/L (ref 19–32)
Calcium: 9.9 mg/dL (ref 8.4–10.5)
Chloride: 104 mEq/L (ref 96–112)
Creatinine, Ser: 0.81 mg/dL (ref 0.40–1.20)
GFR: 90.35 mL/min (ref >60.00)
Glucose, Bld: 79 mg/dL (ref 70–99)
Potassium: 4.3 mEq/L (ref 3.5–5.1)
Sodium: 138 mEq/L (ref 135–145)
Total Bilirubin: 0.6 mg/dL (ref 0.2–1.2)
Total Protein: 7.3 g/dL (ref 6.0–8.3)

## 2021-08-11 LAB — TSH: TSH: 2.57 u[IU]/mL (ref 0.35–5.50)

## 2021-08-11 MED ORDER — ONDANSETRON HCL 4 MG PO TABS: 4.0000 mg | ORAL_TABLET | Freq: Three times a day (TID) | ORAL | 0 refills | Status: DC | PRN

## 2021-08-11 NOTE — Progress Notes (Signed)
Subjective:    Patient ID: Shannon Gordon, female    DOB: May 03, 1980, 41 y.o.   MRN: [id number]  Chief Complaint  Patient presents with   Annual Exam    Pt wants to have thyroid checked due to family hx; usually checked yearly and not been done; no other concerns or issues to discuss; pt is fasting and not had labs since 2021; pt due for CPE    HPI Patient is in today for annual exam.  Acute concerns: Wanting fasting labs checked today  Health maintenance: Lifestyle/ exercise: Treadmill daily Nutrition: Weight watchers - lost close to 20 lbs in last 4-5 months  Mental health: Feeling good overall Caffeine: Tea / coffee Sleep: Good  Substance use: None ETOH: Occasional  Sexual activity: Monogamous Immunizations: UTD Colonoscopy: UTD, small polyp, on 5 year plan now  Pap: Sees GYN regularly  Mammogram: UTD    Past Medical History:  Diagnosis Date   Allergy    Anxiety 05/02/2017   We reviewed hydroxyzine as an option for her anxiety, especially since it is manifested as itching and hives as well.   Arthralgia 05/02/2017   Patient has been followed by orthopedics in the past.  She does take Mobic and as needed tramadol.  I am okay with taking his medications over.   Brown recluse spider bite    GERD (gastroesophageal reflux disease)    History of COVID-19 12/2019   PONV (postoperative nausea and vomiting)    Positive ANA (antinuclear antibody) 05/02/2017   Patient already follows with rheumatology.   Urticaria     Past Surgical History:  Procedure Laterality Date   COLONOSCOPY  03/29/2021   ESOPHAGEAL DILATION  04/07/2020   Procedure: ESOPHAGEAL DILATION;  Surgeon: Lavena Bullion, DO;  Location: [facility];  Service: Gastroenterology;;   ESOPHAGOGASTRODUODENOSCOPY N/A 04/07/2020   Procedure: ESOPHAGOGASTRODUODENOSCOPY (EGD);  Surgeon: Lavena Bullion, DO;  Location: [facility];  Service: Gastroenterology;  Laterality: N/A;   LEG SURGERY Right    right lower  leg tissue removal from brown recluse spider bite   TRANSORAL INCISIONLESS FUNDOPLICATION N/A 40/08/6759   Procedure: TRANSORAL INCISIONLESS FUNDOPLICATION;  Surgeon: Lavena Bullion, DO;  Location: [facility];  Service: Gastroenterology;  Laterality: N/A;    Family History  Problem Relation Age of Onset   Thyroid disease Mother    Hypercalcemia Mother    Colon polyps Mother    Colon polyps Brother 18   Hypothyroidism Maternal Grandmother    Colon cancer Maternal Grandmother    Colon cancer Other        mat. great GF, mat great 52 , mat GF's sister   Esophageal cancer Neg Hx    Rectal cancer Neg Hx    Stomach cancer Neg Hx     Social History   Tobacco Use   Smoking status: Former    Packs/day: 0.50    Years: 10.00    Total pack years: 5.00    Types: Cigarettes    Quit date: 2013    Years since quitting: 10.5   Smokeless tobacco: Never  Vaping Use   Vaping Use: Never used  Substance Use Topics   Alcohol use: Yes    Comment: occ use only    Drug use: Never     No Known Allergies  Review of Systems NEGATIVE UNLESS OTHERWISE INDICATED IN HPI      Objective:     BP 112/74 (BP Location: Right Arm)   Pulse 73   Temp 98.3 F (  36.8 C) (Temporal)   Ht '5\' 4"'$  (1.626 m)   Wt 162 lb 12.8 oz (73.8 kg)   LMP 07/12/2021 (Approximate)   SpO2 99%   BMI 27.94 kg/m   Wt Readings from Last 3 Encounters:  08/11/21 162 lb 12.8 oz (73.8 kg)  03/29/21 181 lb (82.1 kg)  02/02/21 181 lb (82.1 kg)    BP Readings from Last 3 Encounters:  08/11/21 112/74  03/29/21 104/65  02/02/21 110/70     Physical Exam Vitals and nursing note reviewed.  Constitutional:      Appearance: Normal appearance. She is normal weight. She is not toxic-appearing.  HENT:     Head: Normocephalic and atraumatic.     Right Ear: Tympanic membrane, ear canal and external ear normal.     Left Ear: Tympanic membrane, ear canal and external ear normal.     Nose: Nose normal.     Mouth/Throat:      Mouth: Mucous membranes are moist.  Eyes:     Extraocular Movements: Extraocular movements intact.     Conjunctiva/sclera: Conjunctivae normal.     Pupils: Pupils are equal, round, and reactive to light.  Cardiovascular:     Rate and Rhythm: Normal rate and regular rhythm.     Pulses: Normal pulses.     Heart sounds: Normal heart sounds.  Pulmonary:     Effort: Pulmonary effort is normal.     Breath sounds: Normal breath sounds.  Abdominal:     General: Abdomen is flat. Bowel sounds are normal.     Palpations: Abdomen is soft.  Musculoskeletal:        General: Normal range of motion.     Cervical back: Normal range of motion and neck supple.  Skin:    General: Skin is warm and dry.  Neurological:     General: No focal deficit present.     Mental Status: She is alert and oriented to person, place, and time.  Psychiatric:        Mood and Affect: Mood normal.        Behavior: Behavior normal.        Thought Content: Thought content normal.        Judgment: Judgment normal.        Assessment & Plan:   Problem List Items Addressed This Visit   None Visit Diagnoses     Encounter for annual physical exam    -  Primary   Relevant Orders   CBC with Differential/Platelet   Comprehensive metabolic panel   Lipid panel   TSH        Meds ordered this encounter  Medications   ondansetron (ZOFRAN) 4 MG tablet    Sig: Take 1 tablet (4 mg total) by mouth every 8 (eight) hours as needed for nausea or vomiting.    Dispense:  20 tablet    Refill:  0    Order Specific Question:   Supervising Provider    Answer:   Marin Olp [[id number]]   Plan: Age-appropriate screening and counseling performed today. Will check labs and call with results. Preventive measures discussed and printed in AVS for patient.  -GYN for female care, UTD -Doing great with weight loss and exercise -Vision / dental UTD -Requested Zofran to have for intermittent issues with dairy  Patient  Counseling: '[x]'$   Nutrition: Stressed importance of moderation in sodium/caffeine intake, saturated fat and cholesterol, caloric balance, sufficient intake of fresh fruits, vegetables, and fiber.  '[x]'$   Stressed the  importance of regular exercise.   '[x]'$   Substance Abuse: Discussed cessation/primary prevention of tobacco, alcohol, or other drug use; driving or other dangerous activities under the influence; availability of treatment for abuse.   '[]'$   Injury prevention: Discussed safety belts, safety helmets, smoke detector, smoking near bedding or upholstery.   '[]'$   Sexuality: Discussed sexually transmitted diseases, partner selection, use of condoms, avoidance of unintended pregnancy  and contraceptive alternatives.   '[x]'$   Dental health: Discussed importance of regular tooth brushing, flossing, and dental visits.  '[x]'$   Health maintenance and immunizations reviewed. Please refer to Health maintenance section.      Return in about 1 year (around 08/12/2022) for CPE and labs .    Kenika Sahm M Bristyl Mclees, PA-C

## 2021-08-11 NOTE — Patient Instructions (Signed)
Keep up great work!!! [employer] today.  Regular f/up with GYN. Call if concerns.

## 2021-09-21 ENCOUNTER — Encounter: Payer: Self-pay | Admitting: Physician Assistant

## 2021-10-24 ENCOUNTER — Encounter: Payer: Self-pay | Admitting: *Deleted

## 2022-01-02 ENCOUNTER — Encounter: Payer: Self-pay | Admitting: Physician Assistant

## 2022-01-06 ENCOUNTER — Encounter: Payer: Self-pay | Admitting: Internal Medicine

## 2022-01-06 ENCOUNTER — Ambulatory Visit (INDEPENDENT_AMBULATORY_CARE_PROVIDER_SITE_OTHER): Payer: 59 | Admitting: Internal Medicine

## 2022-01-06 VITALS — BP 110/73 | HR 66 | Temp 98.1°F | Resp 12 | Ht 64.0 in | Wt 159.6 lb

## 2022-01-06 DIAGNOSIS — R1011 Right upper quadrant pain: Secondary | ICD-10-CM

## 2022-01-06 MED ORDER — TRAMADOL HCL 50 MG PO TABS: 50.0000 mg | ORAL_TABLET | Freq: Four times a day (QID) | ORAL | 0 refills | Status: AC | PRN

## 2022-01-06 NOTE — Progress Notes (Signed)
[facility]: [phone]   Routine Medical Office Visit  Patient:  Shannon Gordon      Age: 41 y.o.       Sex:  female  Date:   01/06/2022  PCP:    Fredirick Lathe, PA-C   Today's Healthcare Provider: Loralee Pacas, MD  Assessment/Plan:   Zelia was seen today for gallbladder issue.  RUQ abdominal pain Overview: Worse with fatty foods Doesn't feel like hearturn but she has history of erosive esophagitis and following with gastrointestinal specialist  Doesn't have surgeon Reports the pain is under comfortable but not unbearable I explained that nonsteroidal anti-inflammatory drug treatments are relatively unhelpful for this 1 due to it is not a very inflammatory problem is due to back pressure from the gallstone so I gave a few tramadol to hopefully keep her out of the ER   Assessment & Plan: Advised I think its gallstones, has strong family history Advised emergency room if pain gets bad or fevers develop  Orders: -     US ABDOMEN LIMITED RUQ (LIVER/GB); Future -     Ambulatory referral to General Surgery -     traMADol HCl; Take 1 tablet (50 mg total) by mouth every 6 (six) hours as needed for up to 5 days.  Dispense: 20 tablet; Refill: 0   Follow up Primary Care Provider (PCP) within 1-4 weeks  Today's key discussion points and After Visit Summary (AVS) reminders.  She was encouraged to contact our office by phone or message via MyChart if she has any questions or concerns regarding our treatment plan (see AVS). She was given an opportunity to ask questions/clarifications about any aspect of the diagnosis and treatment plan at today's visit. Common side effects, risks, benefits, and alternatives for medications and treatment plan prescribed today were discussed. She expressed understanding of the given instructions.  We discussed red flag symptoms and signs in detail and when to call the office or go to ER if her condition worsens (see AVS). She  expressed understanding and AVS is used to reinforce. Medication list was reconciled and patient instructions and summary information was documented and made available for her to review in the AVS (see AVS).  This entire medical encounter document is also available on MyChart for her to review for accuracy and understanding.  Review of this document is encouraged in the AVS. No barriers to understanding were identified     Subjective:   Shannon Gordon is a 41 y.o. female with the following chart data reviewed: Past Medical History:  Diagnosis Date   Allergy    Anxiety 05/02/2017   We reviewed hydroxyzine as an option for her anxiety, especially since it is manifested as itching and hives as well.   Arthralgia 05/02/2017   Patient has been followed by orthopedics in the past.  She does take Mobic and as needed tramadol.  I am okay with taking his medications over.   Brown recluse spider bite    GERD (gastroesophageal reflux disease)    History of COVID-19 12/2019   PONV (postoperative nausea and vomiting)    Positive ANA (antinuclear antibody) 05/02/2017   Patient already follows with rheumatology.   Urticaria    Outpatient Medications Prior to Visit  Medication Sig   baclofen (LIORESAL) 20 MG tablet Take 1 tablet (20 mg total) by mouth daily as needed for muscle spasms.   BLACK ELDERBERRY PO Take 2 each by mouth daily.   cetirizine (ZYRTEC) 10 MG tablet Take  1 tablet (10 mg total) by mouth 2 (two) times daily.   famotidine (PEPCID) 20 MG tablet Take 1 tablet (20 mg total) by mouth 2 (two) times daily. (Patient taking differently: Take 20 mg by mouth daily.)   levonorgestrel (MIRENA) 20 MCG/24HR IUD 1 each by Intrauterine route once.   meclizine (ANTIVERT) 25 MG tablet Take 1 tablet (25 mg total) by mouth 3 (three) times daily as needed for dizziness. (Patient taking differently: Take 25 mg by mouth as needed for dizziness.)   ondansetron (ZOFRAN) 4 MG tablet Take 1 tablet (4 mg total) by  mouth every 8 (eight) hours as needed for nausea or vomiting.   pantoprazole (PROTONIX) 40 MG tablet Take 1 tablet (40 mg total) by mouth 2 (two) times daily. (Patient taking differently: Take 40 mg by mouth 2 (two) times daily as needed.)   valACYclovir (VALTREX) 500 MG tablet 1 tablet as needed.   gabapentin (NEURONTIN) 100 MG capsule Take 100 mg by mouth as needed. (Patient not taking: Reported on 08/11/2021)   ipratropium (ATROVENT) 0.03 % nasal spray Place 2 sprays into both nostrils 3 (three) times daily. (Patient not taking: Reported on 08/11/2021)   metoCLOPramide (REGLAN) 10 MG tablet Take 1 tablet (10 mg total) by mouth every 6 (six) hours as needed for nausea.   No facility-administered medications prior to visit.     She presented today reporting reason for visit as: Chief Complaint  Patient presents with   Gallbladder issue    Burning on right side of pelvic area for about two months.     Problem focused charting was used to record today's medical interview as follows: Problem  Ruq Abdominal Pain   Worse with fatty foods Doesn't feel like hearturn but she has history of erosive esophagitis and following with gastrointestinal specialist  Doesn't have surgeon Reports the pain is under comfortable but not unbearable I explained that nonsteroidal anti-inflammatory drug treatments are relatively unhelpful for this 1 due to it is not a very inflammatory problem is due to back pressure from the gallstone so I gave a few tramadol to hopefully keep her out of the ER              Objective:  BP 110/73 (BP Location: Left Arm, Patient Position: Sitting)   Pulse 66   Temp 98.1 F (36.7 C) (Temporal)   Resp 12   Ht '5\' 4"'$  (1.626 m)   Wt 159 lb 9.6 oz (72.4 kg)   SpO2 99%   BMI 27.40 kg/m  Physical Exam  Problem-specific:  pain and tenderness ruq only , appears not very uncomfortable at baseline and no rebound or guarding.  Loralee Pacas, MD

## 2022-01-06 NOTE — Patient Instructions (Addendum)
It was a pleasure seeing you today!  Your health and satisfaction are my top priorities. If you believe your experience today was worthy of a 5-star rating, I'd be grateful for your feedback! Loralee Pacas, MD   CHECKOUT CHECKLIST  '[]'$    Schedule next appointment(s):    Any requested lab visits should be scheduled as appointments too  If you are not doing well:  Return to the office sooner Please bring all your medicine bottles to each appointment If your condition begins to worsen or become severe:  go to the emergency room or even call 911  REMINDERS:    '[]'$   (Optional):  Review your clinical notes on MyChart after they are completed.  I am near certain have gallstones and I recommend follow the diet closely and if the pain gets too bad or you have fevers if you go to the hospital and not wait on the outpatient ultrasound.   Today's draft of the physician documented plan for today's visit: (final revisions will be visible on MyChart chart later) RUQ abdominal pain      QUESTIONS & CONCERNS: CLINICAL: please contact us via phone [phone] OR MyChart messaging  LAB & IMAGING:   We will call you if the results are significantly abnormal or you don't use MyChart.  Most normal results will be posted to MyChart immediately and have a clinical review message by Dr. Randol Kern posted within 2-3 business days.   If you have not heard from [place] regarding the results in 2 weeks OR if you need priority reporting, please contact this office. MYCHART:  The fastest way to get your results and easiest way to stay in touch with [place] is by activating your My Chart account. Instructions are located on the last page of this paperwork.  BILLING: xray and lab orders are billed from separate companies and questions./concerns should be directed to the [facility].  For visit charges please discuss with our administrative services COMPLAINTS:  please let Dr. Randol Kern know or see the [facility], by asking at the front desk: we want you to be satisfied with every experience and we would be grateful for the opportunity to address any problems

## 2022-01-06 NOTE — Assessment & Plan Note (Signed)
Advised I think its gallstones, has strong family history Advised emergency room if pain gets bad or fevers develop

## 2022-01-06 NOTE — Telephone Encounter (Signed)
Referral placed.

## 2022-01-10 ENCOUNTER — Ambulatory Visit (HOSPITAL_BASED_OUTPATIENT_CLINIC_OR_DEPARTMENT_OTHER)
Admission: RE | Admit: 2022-01-10 | Discharge: 2022-01-10 | Disposition: A | Discharge status: Home / Self Care | Payer: 59 | Source: Ambulatory Visit | Attending: Internal Medicine | Admitting: Internal Medicine

## 2022-01-10 DIAGNOSIS — R1011 Right upper quadrant pain: Secondary | ICD-10-CM | POA: Diagnosis not present

## 2022-01-10 NOTE — Progress Notes (Signed)
Surprisingly there are no gallstones or gallbladder problems. Still, I think the pain is from gallbladder dysfunction.So I think the next test to look for the problem is called a HIDA scan which checks if the gallbladder can empty properly.  I'd like to order that, with her permission.  Call her and get her permission to order it please

## 2022-01-12 ENCOUNTER — Encounter: Payer: Self-pay | Admitting: *Deleted

## 2022-01-13 ENCOUNTER — Other Ambulatory Visit: Payer: Self-pay

## 2022-01-18 ENCOUNTER — Ambulatory Visit: Payer: 59 | Admitting: Physician Assistant

## 2022-02-27 ENCOUNTER — Encounter: Payer: Self-pay | Admitting: Internal Medicine

## 2022-02-27 ENCOUNTER — Other Ambulatory Visit: Payer: Self-pay | Admitting: Physician Assistant

## 2022-06-07 ENCOUNTER — Encounter: Payer: Self-pay | Admitting: Physician Assistant

## 2022-06-07 ENCOUNTER — Ambulatory Visit (INDEPENDENT_AMBULATORY_CARE_PROVIDER_SITE_OTHER): Payer: 59 | Admitting: *Deleted

## 2022-06-07 DIAGNOSIS — Z23 Encounter for immunization: Secondary | ICD-10-CM | POA: Diagnosis not present

## 2022-06-07 NOTE — Progress Notes (Signed)
Patient present for TDAP  Given on Rt deltoid  Patient tolerated well Patient last TDAP done in 2019 per Alyssa since patient had a cut with a rusty blade  should get another TDAP to be safe.

## 2022-07-26 DIAGNOSIS — M79644 Pain in right finger(s): Secondary | ICD-10-CM | POA: Insufficient documentation

## 2022-08-14 ENCOUNTER — Ambulatory Visit (INDEPENDENT_AMBULATORY_CARE_PROVIDER_SITE_OTHER): Payer: 59 | Admitting: Physician Assistant

## 2022-08-14 ENCOUNTER — Encounter: Payer: Self-pay | Admitting: Physician Assistant

## 2022-08-14 VITALS — BP 108/74 | HR 75 | Temp 97.5°F | Ht 64.0 in | Wt 169.0 lb

## 2022-08-14 DIAGNOSIS — Z0000 Encounter for general adult medical examination without abnormal findings: Secondary | ICD-10-CM

## 2022-08-14 MED ORDER — ONDANSETRON HCL 4 MG PO TABS: 4.0000 mg | ORAL_TABLET | Freq: Three times a day (TID) | ORAL | 0 refills | Status: AC | PRN

## 2022-08-14 NOTE — Progress Notes (Signed)
Subjective:    Patient ID: Shannon Gordon, female    DOB: [birth year], 42 y.o.   MRN: [id number]  Chief Complaint  Patient presents with   Annual Exam   Medication Refill    zofran    Patient is in today for annual exam.  Health maintenance: Lifestyle/ exercise: staying active, swimming  Nutrition: milk-free (triggers reflux, Zofran needed prn) Mental health: No issues Sleep: doing well  Substance use: none  Sexual activity: monogamous  Immunizations: UTD Colonoscopy: first colonoscopy in 2023, one small polyp - every 5 years now  Pap: UTD Mammogram: Needs to schedule, first one was normal    Past Medical History:  Diagnosis Date   Allergy    Anxiety 05/02/2017   We reviewed hydroxyzine as an option for her anxiety, especially since it is manifested as itching and hives as well.   Arthralgia 05/02/2017   Patient has been followed by orthopedics in the past.  She does take Mobic and as needed tramadol.  I am okay with taking his medications over.   Brown recluse spider bite    GERD (gastroesophageal reflux disease)    History of COVID-19 12/2019   PONV (postoperative nausea and vomiting)    Positive ANA (antinuclear antibody) 05/02/2017   Patient already follows with rheumatology.   Urticaria     Past Surgical History:  Procedure Laterality Date   COLONOSCOPY  03/29/2021   ESOPHAGEAL DILATION  04/07/2020   Procedure: ESOPHAGEAL DILATION;  Surgeon: Shellia Cleverly, DO;  Location: [facility];  Service: Gastroenterology;;   ESOPHAGOGASTRODUODENOSCOPY N/A 04/07/2020   Procedure: ESOPHAGOGASTRODUODENOSCOPY (EGD);  Surgeon: Shellia Cleverly, DO;  Location: [facility];  Service: Gastroenterology;  Laterality: N/A;   LEG SURGERY Right    right lower leg tissue removal from brown recluse spider bite   TRANSORAL INCISIONLESS FUNDOPLICATION N/A 04/07/2020   Procedure: TRANSORAL INCISIONLESS FUNDOPLICATION;  Surgeon: Shellia Cleverly, DO;  Location: [facility];   Service: Gastroenterology;  Laterality: N/A;    Family History  Problem Relation Age of Onset   Thyroid disease Mother    Hypercalcemia Mother    Colon polyps Mother    Colon polyps Brother 88   Hypothyroidism Maternal Grandmother    Colon cancer Maternal Grandmother    Colon cancer Other        mat. great GF, mat great Aunt , mat GF's sister   Esophageal cancer Neg Hx    Rectal cancer Neg Hx    Stomach cancer Neg Hx     Social History   Tobacco Use   Smoking status: Former    Current packs/day: 0.00    Average packs/day: 0.5 packs/day for 10.0 years (5.0 ttl pk-yrs)    Types: Cigarettes    Start date: 2003    Quit date: 2013    Years since quitting: 11.5   Smokeless tobacco: Never  Vaping Use   Vaping status: Never Used  Substance Use Topics   Alcohol use: Yes    Comment: occ use only    Drug use: Never     No Known Allergies  Review of Systems NEGATIVE UNLESS OTHERWISE INDICATED IN HPI      Objective:     BP 108/74 (BP Location: Left Arm, Patient Position: Sitting, Cuff Size: Normal)   Pulse 75   Temp (!) 97.5 F (36.4 C)   Ht 5\' 4"  (1.626 m)   Wt 169 lb (76.7 kg)   SpO2 98%   BMI 29.01 kg/m   Wt Readings  from Last 3 Encounters:  08/14/22 169 lb (76.7 kg)  01/06/22 159 lb 9.6 oz (72.4 kg)  08/11/21 162 lb 12.8 oz (73.8 kg)    BP Readings from Last 3 Encounters:  08/14/22 108/74  01/06/22 110/73  08/11/21 112/74     Physical Exam Vitals and nursing note reviewed.  Constitutional:      Appearance: Normal appearance. She is normal weight. She is not toxic-appearing.  HENT:     Head: Normocephalic and atraumatic.     Right Ear: Tympanic membrane, ear canal and external ear normal.     Left Ear: Tympanic membrane, ear canal and external ear normal.     Nose: Nose normal.     Mouth/Throat:     Mouth: Mucous membranes are moist.  Eyes:     Extraocular Movements: Extraocular movements intact.     Conjunctiva/sclera: Conjunctivae normal.      Pupils: Pupils are equal, round, and reactive to light.  Cardiovascular:     Rate and Rhythm: Normal rate and regular rhythm.     Pulses: Normal pulses.     Heart sounds: Normal heart sounds.  Pulmonary:     Effort: Pulmonary effort is normal.     Breath sounds: Normal breath sounds.  Abdominal:     General: Abdomen is flat. Bowel sounds are normal.     Palpations: Abdomen is soft.  Musculoskeletal:        General: Normal range of motion.     Cervical back: Normal range of motion and neck supple.  Skin:    General: Skin is warm and dry.  Neurological:     General: No focal deficit present.     Mental Status: She is alert and oriented to person, place, and time.  Psychiatric:        Mood and Affect: Mood normal.        Behavior: Behavior normal.        Thought Content: Thought content normal.        Judgment: Judgment normal.        Assessment & Plan:  Encounter for annual physical exam -     Lipid panel; Future -     Comprehensive metabolic panel; Future -     CBC with Differential/Platelet; Future -     TSH; Future  Other orders -     Ondansetron HCl; Take 1 tablet (4 mg total) by mouth every 8 (eight) hours as needed for nausea or vomiting.  Dispense: 20 tablet; Refill: 0   Age-appropriate screening and counseling performed today. Will check labs and call with results. Preventive measures discussed and printed in AVS for patient.   Patient Counseling: [x]   Nutrition: Stressed importance of moderation in sodium/caffeine intake, saturated fat and cholesterol, caloric balance, sufficient intake of fresh fruits, vegetables, and fiber.  [x]   Stressed the importance of regular exercise.   []   Substance Abuse: Discussed cessation/primary prevention of tobacco, alcohol, or other drug use; driving or other dangerous activities under the influence; availability of treatment for abuse.   []   Injury prevention: Discussed safety belts, safety helmets, smoke detector, smoking near  bedding or upholstery.   []   Sexuality: Discussed sexually transmitted diseases, partner selection, use of condoms, avoidance of unintended pregnancy  and contraceptive alternatives.   [x]   Dental health: Discussed importance of regular tooth brushing, flossing, and dental visits.  [x]   Health maintenance and immunizations reviewed. Please refer to Health maintenance section.       Return in about  1 year (around 08/14/2023) for physical.    Severa Jeremiah M Schyler Butikofer, PA-C

## 2022-08-23 ENCOUNTER — Other Ambulatory Visit (INDEPENDENT_AMBULATORY_CARE_PROVIDER_SITE_OTHER): Payer: 59

## 2022-08-23 DIAGNOSIS — Z0000 Encounter for general adult medical examination without abnormal findings: Secondary | ICD-10-CM | POA: Diagnosis not present

## 2022-08-23 LAB — COMPREHENSIVE METABOLIC PANEL
ALT: 9 U/L (ref 0–35)
AST: 18 U/L (ref 0–37)
Albumin: 4.2 g/dL (ref 3.5–5.2)
Alkaline Phosphatase: 50 U/L (ref 39–117)
BUN: 19 mg/dL (ref 6–23)
CO2: 26 mEq/L (ref 19–32)
Calcium: 9.5 mg/dL (ref 8.4–10.5)
Chloride: 104 mEq/L (ref 96–112)
Creatinine, Ser: 0.81 mg/dL (ref 0.40–1.20)
GFR: 89.7 mL/min (ref >60.00)
Glucose, Bld: 90 mg/dL (ref 70–99)
Potassium: 4.1 mEq/L (ref 3.5–5.1)
Sodium: 137 mEq/L (ref 135–145)
Total Bilirubin: 0.6 mg/dL (ref 0.2–1.2)
Total Protein: 6.8 g/dL (ref 6.0–8.3)

## 2022-08-23 LAB — CBC WITH DIFFERENTIAL/PLATELET
Basophils Absolute: 0.1 10*3/uL (ref 0.0–0.1)
Basophils Relative: 1 % (ref 0.0–3.0)
Eosinophils Absolute: 0.1 10*3/uL (ref 0.0–0.7)
Eosinophils Relative: 1.2 % (ref 0.0–5.0)
HCT: 40 % (ref 36.0–46.0)
Hemoglobin: 13.2 g/dL (ref 12.0–15.0)
Lymphocytes Relative: 37.3 % (ref 12.0–46.0)
Lymphs Abs: 1.9 10*3/uL (ref 0.7–4.0)
MCHC: 32.9 g/dL (ref 30.0–36.0)
MCV: 95.6 fl (ref 78.0–100.0)
Monocytes Absolute: 0.3 10*3/uL (ref 0.1–1.0)
Monocytes Relative: 6.2 % (ref 3.0–12.0)
Neutro Abs: 2.7 10*3/uL (ref 1.4–7.7)
Neutrophils Relative %: 54.3 % (ref 43.0–77.0)
Platelets: 225 10*3/uL (ref 150.0–400.0)
RBC: 4.19 Mil/uL (ref 3.87–5.11)
RDW: 13.5 % (ref 11.5–15.5)
WBC: 5 10*3/uL (ref 4.0–10.5)

## 2022-08-23 LAB — LIPID PANEL
Cholesterol: 195 mg/dL (ref 0–200)
HDL: 79.6 mg/dL (ref >39.00)
LDL Cholesterol: 107 mg/dL — ABNORMAL HIGH (ref 0–99)
NonHDL: 115.86
Total CHOL/HDL Ratio: 2
Triglycerides: 44 mg/dL (ref 0.0–149.0)
VLDL: 8.8 mg/dL (ref 0.0–40.0)

## 2022-08-23 LAB — TSH: TSH: 1.31 u[IU]/mL (ref 0.35–5.50)

## 2022-10-01 ENCOUNTER — Other Ambulatory Visit: Payer: Self-pay | Admitting: Physician Assistant

## 2022-11-02 ENCOUNTER — Encounter: Payer: Self-pay | Admitting: Physician Assistant

## 2022-11-02 NOTE — Telephone Encounter (Signed)
Please see patient message and advise.

## 2022-11-05 ENCOUNTER — Other Ambulatory Visit: Payer: Self-pay | Admitting: Physician Assistant

## 2022-11-05 MED ORDER — ALBENDAZOLE 200 MG PO TABS: ORAL_TABLET | ORAL | 0 refills | Status: DC

## 2022-11-08 ENCOUNTER — Other Ambulatory Visit: Payer: Self-pay | Admitting: Physician Assistant

## 2022-11-08 DIAGNOSIS — R1011 Right upper quadrant pain: Secondary | ICD-10-CM

## 2023-02-02 ENCOUNTER — Encounter (HOSPITAL_COMMUNITY): Payer: 59

## 2023-05-22 ENCOUNTER — Other Ambulatory Visit (HOSPITAL_BASED_OUTPATIENT_CLINIC_OR_DEPARTMENT_OTHER): Payer: Self-pay

## 2023-05-22 MED ORDER — LISDEXAMFETAMINE DIMESYLATE 20 MG PO CAPS
20.0000 mg | ORAL_CAPSULE | Freq: Every day | ORAL | 0 refills | Status: AC
  Filled 2023-05-22: qty 30, 30d supply, fill #0

## 2023-06-14 ENCOUNTER — Other Ambulatory Visit (HOSPITAL_BASED_OUTPATIENT_CLINIC_OR_DEPARTMENT_OTHER): Payer: Self-pay

## 2023-06-14 MED ORDER — LISDEXAMFETAMINE DIMESYLATE 10 MG PO CAPS
10.0000 mg | ORAL_CAPSULE | Freq: Two times a day (BID) | ORAL | 0 refills | Status: AC
  Filled 2023-06-14: qty 60, 30d supply, fill #0

## 2023-07-12 ENCOUNTER — Other Ambulatory Visit (HOSPITAL_BASED_OUTPATIENT_CLINIC_OR_DEPARTMENT_OTHER): Payer: Self-pay

## 2023-07-12 ENCOUNTER — Other Ambulatory Visit: Payer: Self-pay

## 2023-07-12 MED ORDER — LISDEXAMFETAMINE DIMESYLATE 10 MG PO CHEW
1.0000 | CHEWABLE_TABLET | Freq: Two times a day (BID) | ORAL | 0 refills | Status: DC
  Filled 2023-07-12: qty 60, 30d supply, fill #0

## 2023-07-14 ENCOUNTER — Other Ambulatory Visit (HOSPITAL_BASED_OUTPATIENT_CLINIC_OR_DEPARTMENT_OTHER): Payer: Self-pay

## 2023-07-17 ENCOUNTER — Other Ambulatory Visit: Payer: Self-pay | Admitting: Physician Assistant

## 2023-08-13 ENCOUNTER — Encounter: Payer: Self-pay | Admitting: Internal Medicine

## 2023-08-13 ENCOUNTER — Ambulatory Visit (INDEPENDENT_AMBULATORY_CARE_PROVIDER_SITE_OTHER): Admitting: Internal Medicine

## 2023-08-13 ENCOUNTER — Other Ambulatory Visit (HOSPITAL_BASED_OUTPATIENT_CLINIC_OR_DEPARTMENT_OTHER): Payer: Self-pay

## 2023-08-13 VITALS — BP 110/70 | HR 92 | Temp 98.0°F | Ht 64.0 in | Wt 179.2 lb

## 2023-08-13 DIAGNOSIS — Z91040 Latex allergy status: Secondary | ICD-10-CM | POA: Insufficient documentation

## 2023-08-13 MED ORDER — PREDNISONE 20 MG PO TABS
ORAL_TABLET | ORAL | 0 refills | Status: DC
  Filled 2023-08-13: qty 10, 7d supply, fill #0

## 2023-08-13 MED ORDER — LEVOCETIRIZINE DIHYDROCHLORIDE 5 MG PO TABS
5.0000 mg | ORAL_TABLET | Freq: Every evening | ORAL | 2 refills | Status: AC
  Filled 2023-08-13: qty 30, 30d supply, fill #0

## 2023-08-13 NOTE — Progress Notes (Signed)
 ==============================  [facility]: [phone]   -- Medical Office Visit --  Patient: Shannon Gordon      Age: 43 y.o.       Sex:  female  Date:   08/13/2023 Today's Healthcare Provider: Bernardino KANDICE Cone, MD  ==============================   Chief Complaint: Rash (Was using rubber gloves to stain floors started yesterday itching and has hives on arms and tops of legs thinks it is from the rubber gloves.)  Discussed the use of AI scribe software for clinical note transcription with the patient, who gave verbal consent to proceed.  History of Present Illness 43 year old female with a latex allergy  who presents with contact dermatitis after using rubber gloves.  She developed pruritic hives on her arms after staining her floors while wearing rubber gloves. She denies any involvement of yard work.  The itching is described as uncomfortable. She took two Zyrtec  and a Pepcid  this morning to manage the symptoms. No throat involvement at this time. The hives have appeared several times before, and she has previously experienced similar reactions.  She has a known latex allergy  and suspects the gloves may have contained latex, although she is unsure. She has never reacted to nitrile gloves used in medical settings. 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 She is unsure if the gloves contained formaldehyde.  She has been using Zyrtec  and Pepcid  to manage her symptoms and has previously found relief with steroids, although she has not taken them yet for this episode. She has washed the affected areas multiple times since the exposure.  Background Reviewed: Problem List: has Anxiety; Positive ANA (antinuclear antibody); Arthralgia; GERD (gastroesophageal reflux disease); Other cervical disc degeneration, unspecified cervical region; Degeneration of lumbar or lumbosacral intervertebral disc; Erosive  esophagitis; Heartburn; GERD with esophagitis; History of fundoplication; Fibromyalgia; RUQ abdominal pain; and Latex allergy  on their problem list. Past Medical History:  has a past medical history of Allergy  (On and off for years), Anxiety (05/02/2017), Arthralgia (05/02/2017), [place] recluse spider bite, GERD (gastroesophageal reflux disease) (Sept 2013), History of COVID-19 (12/2019), PONV (postoperative nausea and vomiting), Positive ANA (antinuclear antibody) (05/02/2017), and Urticaria. Past Surgical History:   has a past surgical history that includes Leg Surgery (Right); Esophagogastroduodenoscopy (N/A, 04/07/2020); Transoral incisionless fundoplication (N/A, 04/07/2020); Esophageal dilation (04/07/2020); and Colonoscopy (03/29/2021). Social History:   reports that she quit smoking about 12 years ago. Her smoking use included cigarettes. She started smoking about 22 years ago. She has a 5 pack-year smoking history. She has never used smokeless tobacco. She reports current alcohol use. She reports that she does not use drugs. Family History:  family history includes Cancer in her maternal grandmother; Colon cancer in her maternal grandmother and another family member; Colon polyps in her mother; Colon polyps (age of onset: 45) in her brother; Heart disease in her paternal grandmother; Hypercalcemia in her mother; Hypothyroidism in her maternal grandmother; Stroke in her maternal grandfather and maternal grandmother; Thyroid  disease in her mother. Allergies:  is allergic to formaldehyde and latex.   Medication Reconciliation: Current Outpatient Medications on File Prior to Visit  Medication Sig   albendazole  (ALBENZA ) 200 MG tablet 400 mg as a single dose po; repeat dose in 2 weeks   levonorgestrel (MIRENA) 20 MCG/24HR IUD 1 each by Intrauterine route once.   lisdexamfetamine (VYVANSE ) 10 MG capsule Take 1 capsule by mouth twice a day   Lisdexamfetamine Dimesylate  (VYVANSE ) 10 MG CHEW  Chew 1  tablet (10 mg total) by mouth 2 (two) times daily.   ondansetron  (ZOFRAN ) 4 MG tablet Take 1 tablet (4 mg total) by mouth every 8 (eight) hours as needed for nausea or vomiting.   valACYclovir (VALTREX) 500 MG tablet 1 tablet as needed.   baclofen  (LIORESAL ) 20 MG tablet TAKE 1 TABLET(20 MG) BY MOUTH DAILY AS NEEDED FOR MUSCLE SPASMS   BLACK ELDERBERRY PO Take 2 each by mouth daily. (Patient not taking: Reported on 08/14/2022)   cetirizine  (ZYRTEC ) 10 MG tablet Take 1 tablet (10 mg total) by mouth 2 (two) times daily.   famotidine  (PEPCID ) 20 MG tablet Take 1 tablet (20 mg total) by mouth 2 (two) times daily. (Patient taking differently: Take 20 mg by mouth daily.)   gabapentin (NEURONTIN) 100 MG capsule Take 100 mg by mouth as needed. (Patient not taking: Reported on 08/11/2021)   ipratropium (ATROVENT ) 0.03 % nasal spray Place 2 sprays into both nostrils 3 (three) times daily. (Patient not taking: Reported on 08/11/2021)   lisdexamfetamine (VYVANSE ) 20 MG capsule Take 1 capsule by mouth daily (Patient not taking: Reported on 08/13/2023)   meclizine  (ANTIVERT ) 25 MG tablet Take 1 tablet (25 mg total) by mouth 3 (three) times daily as needed for dizziness. (Patient taking differently: Take 25 mg by mouth as needed for dizziness.)   metoCLOPramide  (REGLAN ) 10 MG tablet Take 1 tablet (10 mg total) by mouth every 6 (six) hours as needed for nausea.   pantoprazole  (PROTONIX ) 40 MG tablet Take 1 tablet (40 mg total) by mouth 2 (two) times daily. (Patient taking differently: Take 40 mg by mouth 2 (two) times daily as needed.)   No current facility-administered medications on file prior to visit.  There are no discontinued medications.   Physical Exam:    08/13/2023    1:49 PM 08/14/2022    8:41 AM 01/06/2022   12:54 PM  Vitals with BMI  Height 5' 4 5' 4 5' 4  Weight 179 lbs 3 oz 169 lbs 159 lbs 10 oz  BMI 30.74 28.99 27.38  Systolic 110 108 889  Diastolic 70 74 73  Pulse 92 75 66  Vital signs  reviewed.  Nursing notes reviewed. Weight trend reviewed. Physical Exam General Appearance:  No acute distress appreciable.   Well-groomed, healthy-appearing female.  Well proportioned with no abnormal fat distribution.  Good muscle tone. Pulmonary:  Normal work of breathing at rest, no respiratory distress apparent. SpO2: 98 %  Musculoskeletal: All extremities are intact.  Neurological:  Awake, alert, oriented, and engaged.  No obvious focal neurological deficits or cognitive impairments.  Sensorium seems unclouded.   Speech is clear and coherent with logical content. Psychiatric:  Appropriate mood, pleasant and cooperative demeanor, thoughtful and engaged during the exam Physical Exam SKIN: Skin on one arm is normal with lacy rash with no immediate reaction.       ASSESSMENT & PLAN   Assessment & Plan Latex allergy  She presents with contact dermatitis likely due to a latex allergy , evidenced by rash distribution corresponding to latex glove exposure. The condition is mild but uncomfortable, with itching and hives. She uses Zyrtec  and Pepcid  for symptom relief. Emphasized avoiding latex and using nitrile gloves to prevent future reactions. Prescribe Xyzal  as a non-drowsy antihistamine and prednisone  for severe reactions involving the throat or face. Highlight the importance of moisturizing to prevent skin damage from scratching and suggest maintaining well-groomed nails to minimize skin injury. Informed that washing will not prevent future outbreaks  and that avoiding latex is crucial. She is aware of the mild nature of the allergy  and the importance of documenting it to prevent complications during medical procedures.  She has a confirmed formaldehyde allergy , ruled out as the cause of current contact dermatitis based on history and exposure nature. Formaldehyde is uncommon in rubber gloves, and the reaction pattern fits a latex allergy . Document formaldehyde allergy  in the medical  record.    ORDER ASSOCIATIONS  #   DIAGNOSIS / CONDITION ICD-10 ENCOUNTER ORDER     ICD-10-CM   1. Latex allergy   Z91.040 predniSONE  (DELTASONE ) 20 MG tablet    levocetirizine (XYZAL ) 5 MG tablet      Meds ordered this encounter  Medications   predniSONE  (DELTASONE ) 20 MG tablet    Sig: 2 tablets (40 mg total) daily for 3 days, THEN 1 tablet (20 mg total) daily for 4 days.    Dispense:  10 tablet    Refill:  0   levocetirizine (XYZAL ) 5 MG tablet    Sig: Take 1 tablet (5 mg total) by mouth every evening.    Dispense:  30 tablet    Refill:  2       This document was synthesized by artificial intelligence ([employer]) using HIPAA-compliant recording of the clinical interaction;   We discussed the use of AI scribe software for clinical note transcription with the patient, who gave verbal consent to proceed. additional Info: This encounter employed state-of-the-art, real-time, collaborative documentation. The patient actively reviewed and assisted in updating their electronic medical record on a shared screen, ensuring transparency and facilitating joint problem-solving for the problem list, overview, and plan. This approach promotes accurate, informed care. The treatment plan was discussed and reviewed in detail, including medication safety, potential side effects, and all patient questions. We confirmed understanding and comfort with the plan. Follow-up instructions were established, including contacting the office for any concerns, returning if symptoms worsen, persist, or new symptoms develop, and precautions for potential emergency department visits.

## 2023-08-13 NOTE — Assessment & Plan Note (Signed)
 She presents with contact dermatitis likely due to a latex allergy , evidenced by rash distribution corresponding to latex glove exposure. The condition is mild but uncomfortable, with itching and hives. She uses Zyrtec  and Pepcid  for symptom relief. Emphasized avoiding latex and using nitrile gloves to prevent future reactions. Prescribe Xyzal  as a non-drowsy antihistamine and prednisone  for severe reactions involving the throat or face. Highlight the importance of moisturizing to prevent skin damage from scratching and suggest maintaining well-groomed nails to minimize skin injury. Informed that washing will not prevent future outbreaks and that avoiding latex is crucial. She is aware of the mild nature of the allergy  and the importance of documenting it to prevent complications during medical procedures.

## 2023-08-13 NOTE — Patient Instructions (Signed)
 History of Present Illness Shannon Gordon is a 43 year old female with a latex allergy  who presents with contact dermatitis after using rubber gloves.  She developed pruritic hives on her arms after staining her floors while wearing rubber gloves. She denies any involvement of yard work.  The itching is described as uncomfortable. She took two Zyrtec  and a Pepcid  this morning to manage the symptoms. No throat involvement at this time. The hives have appeared several times before, and she has previously experienced similar reactions.  She has a known latex allergy  and suspects the gloves may have contained latex, although she is unsure. She has never reacted to nitrile gloves used in medical settings. 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 She is unsure if the gloves contained formaldehyde.  She has been using Zyrtec  and Pepcid  to manage her symptoms and has previously found relief with steroids, although she has not taken them yet for this episode. She has washed the affected areas multiple times since the exposure.  Past Medical History - Latex allergy  - Formaldehyde allergy  - Gold allergy     Medications - Zyrtec  - Pepcid   Social History - Exposures: Latex, Formaldehyde, Gold   Physical Exam SKIN: Skin on one arm is normal with no immediate reaction.  Assessment and Plan Contact Dermatitis due to Latex Allergy    She presents with contact dermatitis likely due to a latex allergy , evidenced by rash distribution corresponding to latex glove exposure. The condition is mild but uncomfortable, with itching and hives. She uses Zyrtec  and Pepcid  for symptom relief. Emphasized avoiding latex and using nitrile gloves to prevent future reactions. Prescribe Xyzal  as a non-drowsy antihistamine and prednisone  for severe reactions involving the throat or face. Highlight the importance of moisturizing to prevent skin damage from scratching and  suggest maintaining well-groomed nails to minimize skin injury. Informed that washing will not prevent future outbreaks and that avoiding latex is crucial. She is aware of the mild nature of the allergy  and the importance of documenting it to prevent complications during medical procedures.  Formaldehyde Allergy    She has a confirmed formaldehyde allergy , ruled out as the cause of current contact dermatitis based on history and exposure nature. Formaldehyde is uncommon in rubber gloves, and the reaction pattern fits a latex allergy . Document formaldehyde allergy  in the medical record.  Recording duration: 13 minutes

## 2023-08-15 ENCOUNTER — Other Ambulatory Visit (HOSPITAL_BASED_OUTPATIENT_CLINIC_OR_DEPARTMENT_OTHER): Payer: Self-pay

## 2023-08-15 MED ORDER — LISDEXAMFETAMINE DIMESYLATE 10 MG PO CHEW
1.0000 | CHEWABLE_TABLET | Freq: Two times a day (BID) | ORAL | 0 refills | Status: DC
  Filled 2023-08-15: qty 60, 30d supply, fill #0

## 2023-08-20 ENCOUNTER — Ambulatory Visit (INDEPENDENT_AMBULATORY_CARE_PROVIDER_SITE_OTHER): Admitting: Physician Assistant

## 2023-08-20 ENCOUNTER — Other Ambulatory Visit (HOSPITAL_BASED_OUTPATIENT_CLINIC_OR_DEPARTMENT_OTHER): Payer: Self-pay

## 2023-08-20 ENCOUNTER — Encounter: Payer: Self-pay | Admitting: Physician Assistant

## 2023-08-20 VITALS — BP 110/74 | HR 75 | Temp 98.1°F | Ht 64.0 in | Wt 180.4 lb

## 2023-08-20 DIAGNOSIS — M5030 Other cervical disc degeneration, unspecified cervical region: Secondary | ICD-10-CM

## 2023-08-20 DIAGNOSIS — Z0000 Encounter for general adult medical examination without abnormal findings: Secondary | ICD-10-CM | POA: Diagnosis not present

## 2023-08-20 MED ORDER — BACLOFEN 20 MG PO TABS
20.0000 mg | ORAL_TABLET | Freq: Every day | ORAL | 2 refills | Status: AC | PRN
  Filled 2023-08-20: qty 30, 30d supply, fill #0
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
## 2023-08-21 ENCOUNTER — Other Ambulatory Visit (INDEPENDENT_AMBULATORY_CARE_PROVIDER_SITE_OTHER)

## 2023-08-21 DIAGNOSIS — Z0000 Encounter for general adult medical examination without abnormal findings: Secondary | ICD-10-CM

## 2023-08-21 LAB — COMPREHENSIVE METABOLIC PANEL WITH GFR
ALT: 25 U/L (ref 0–35)
AST: 30 U/L (ref 0–37)
Albumin: 4.2 g/dL (ref 3.5–5.2)
Alkaline Phosphatase: 51 U/L (ref 39–117)
BUN: 11 mg/dL (ref 6–23)
CO2: 31 meq/L (ref 19–32)
Calcium: 9.3 mg/dL (ref 8.4–10.5)
Chloride: 100 meq/L (ref 96–112)
Creatinine, Ser: 0.75 mg/dL (ref 0.40–1.20)
GFR: 97.69 mL/min (ref >60.00)
Glucose, Bld: 75 mg/dL (ref 70–99)
Potassium: 4.1 meq/L (ref 3.5–5.1)
Sodium: 137 meq/L (ref 135–145)
Total Bilirubin: 0.5 mg/dL (ref 0.2–1.2)
Total Protein: 6.7 g/dL (ref 6.0–8.3)

## 2023-08-21 LAB — LIPID PANEL
Cholesterol: 207 mg/dL — ABNORMAL HIGH (ref 0–200)
HDL: 84 mg/dL (ref >39.00)
LDL Cholesterol: 111 mg/dL — ABNORMAL HIGH (ref 0–99)
NonHDL: 123.45
Total CHOL/HDL Ratio: 2
Triglycerides: 62 mg/dL (ref 0.0–149.0)
VLDL: 12.4 mg/dL (ref 0.0–40.0)

## 2023-08-21 LAB — CBC WITH DIFFERENTIAL/PLATELET
Basophils Absolute: 0 K/uL (ref 0.0–0.1)
Basophils Relative: 0.6 % (ref 0.0–3.0)
Eosinophils Absolute: 0.1 K/uL (ref 0.0–0.7)
Eosinophils Relative: 1.7 % (ref 0.0–5.0)
HCT: 39.7 % (ref 36.0–46.0)
Hemoglobin: 13.5 g/dL (ref 12.0–15.0)
Lymphocytes Relative: 46.1 % — ABNORMAL HIGH (ref 12.0–46.0)
Lymphs Abs: 3.5 K/uL (ref 0.7–4.0)
MCHC: 34 g/dL (ref 30.0–36.0)
MCV: 93.7 fl (ref 78.0–100.0)
Monocytes Absolute: 0.4 K/uL (ref 0.1–1.0)
Monocytes Relative: 5.7 % (ref 3.0–12.0)
Neutro Abs: 3.5 K/uL (ref 1.4–7.7)
Neutrophils Relative %: 45.9 % (ref 43.0–77.0)
Platelets: 276 K/uL (ref 150.0–400.0)
RBC: 4.23 Mil/uL (ref 3.87–5.11)
RDW: 13.6 % (ref 11.5–15.5)
WBC: 7.6 K/uL (ref 4.0–10.5)

## 2023-08-21 LAB — TSH: TSH: 2.13 u[IU]/mL (ref 0.35–5.50)

## 2023-08-21 LAB — HEMOGLOBIN A1C: Hgb A1c MFr Bld: 5.5 % (ref 4.6–6.5)

## 2023-08-23 ENCOUNTER — Ambulatory Visit: Payer: Self-pay | Admitting: Physician Assistant

## 2023-08-23 ENCOUNTER — Telehealth: Payer: Self-pay | Admitting: Physician Assistant

## 2023-08-23 NOTE — Telephone Encounter (Signed)
 Received fax via [facility] from [facility] stating that they are not able to process our med records request due to needing a authorization w/ patient's signature in order to release the records. I have called pt and LVM explaining this. I also informed patient that she could come in at anytime to fill out the form.

## 2023-08-27 NOTE — Telephone Encounter (Signed)
 Noted nothing further needed at this time until patient completes appropriate form

## 2023-09-13 ENCOUNTER — Other Ambulatory Visit (HOSPITAL_BASED_OUTPATIENT_CLINIC_OR_DEPARTMENT_OTHER): Payer: Self-pay

## 2023-09-13 ENCOUNTER — Other Ambulatory Visit: Payer: Self-pay

## 2023-09-13 MED ORDER — LISDEXAMFETAMINE DIMESYLATE 10 MG PO CHEW
1.0000 | CHEWABLE_TABLET | Freq: Two times a day (BID) | ORAL | 0 refills | Status: AC
  Filled 2023-09-13: qty 60, 30d supply, fill #0

## 2023-09-14 ENCOUNTER — Other Ambulatory Visit: Payer: Self-pay

## 2023-09-14 ENCOUNTER — Other Ambulatory Visit (HOSPITAL_BASED_OUTPATIENT_CLINIC_OR_DEPARTMENT_OTHER): Payer: Self-pay

## 2023-09-20 ENCOUNTER — Other Ambulatory Visit (HOSPITAL_BASED_OUTPATIENT_CLINIC_OR_DEPARTMENT_OTHER): Payer: Self-pay

## 2023-09-20 MED ORDER — METHYLPHENIDATE HCL ER (CD) 10 MG PO CPCR
10.0000 mg | ORAL_CAPSULE | Freq: Two times a day (BID) | ORAL | 0 refills | Status: AC
  Filled 2023-09-20: qty 14, 7d supply, fill #0

## 2023-10-03 ENCOUNTER — Other Ambulatory Visit (HOSPITAL_BASED_OUTPATIENT_CLINIC_OR_DEPARTMENT_OTHER): Payer: Self-pay

## 2023-10-03 MED ORDER — METHYLPREDNISOLONE 4 MG PO TBPK
ORAL_TABLET | ORAL | 0 refills | Status: AC
  Filled 2023-10-03: qty 21, 6d supply, fill #0

## 2023-10-18 ENCOUNTER — Other Ambulatory Visit (HOSPITAL_BASED_OUTPATIENT_CLINIC_OR_DEPARTMENT_OTHER): Payer: Self-pay

## 2023-10-18 MED ORDER — LISDEXAMFETAMINE DIMESYLATE 30 MG PO CHEW
30.0000 mg | CHEWABLE_TABLET | Freq: Every day | ORAL | 0 refills | Status: DC
  Filled 2023-10-18: qty 30, 30d supply, fill #0

## 2023-11-15 ENCOUNTER — Other Ambulatory Visit (HOSPITAL_BASED_OUTPATIENT_CLINIC_OR_DEPARTMENT_OTHER): Payer: Self-pay

## 2023-11-15 MED ORDER — LISDEXAMFETAMINE DIMESYLATE 30 MG PO CHEW
30.0000 mg | CHEWABLE_TABLET | Freq: Every day | ORAL | 0 refills | Status: DC
  Filled 2023-11-20: qty 30, 30d supply, fill #0

## 2023-11-17 IMAGING — MG MM DIGITAL SCREENING BILAT W/ TOMO AND CAD
6 of 12 series · 6 of 36 positions shown · non-contrast
Comparison: None.

CLINICAL DATA: Screening.

EXAM:
DIGITAL SCREENING BILATERAL MAMMOGRAM WITH TOMOSYNTHESIS AND CAD
TECHNIQUE: Bilateral screening digital craniocaudal and mediolateral oblique
mammograms were obtained. Bilateral screening digital breast
tomosynthesis was performed. The images were evaluated with
computer-aided detection.

[R MLO synth-2D]
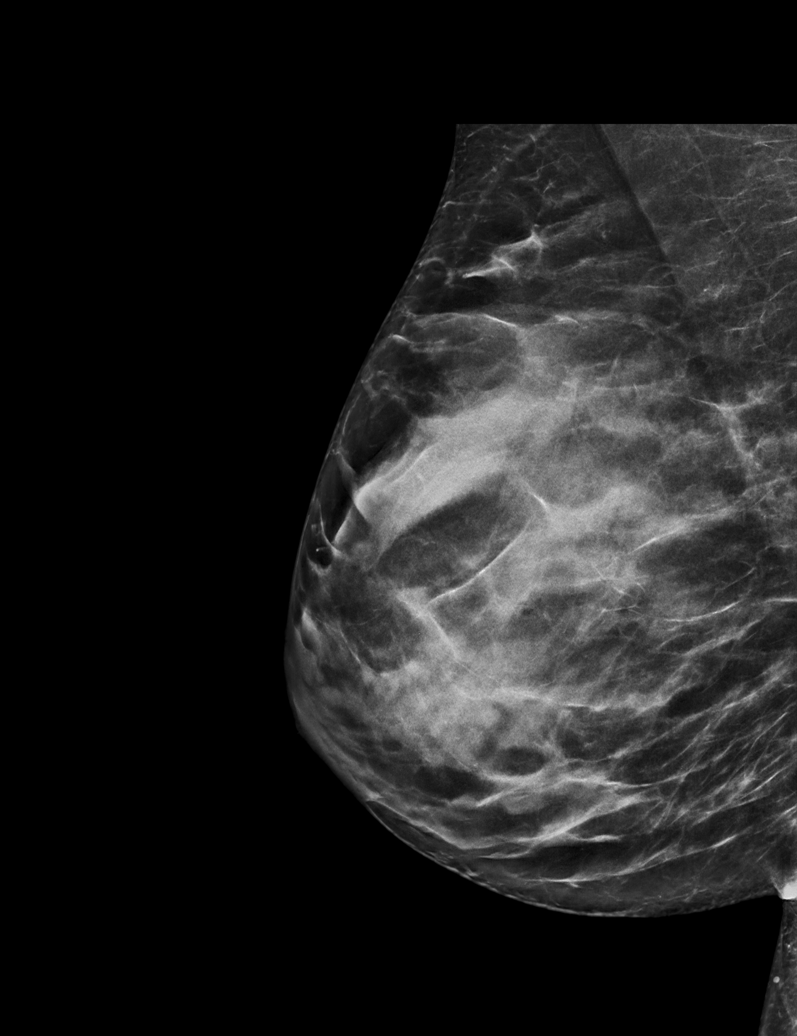

[L MLO synth-2D]
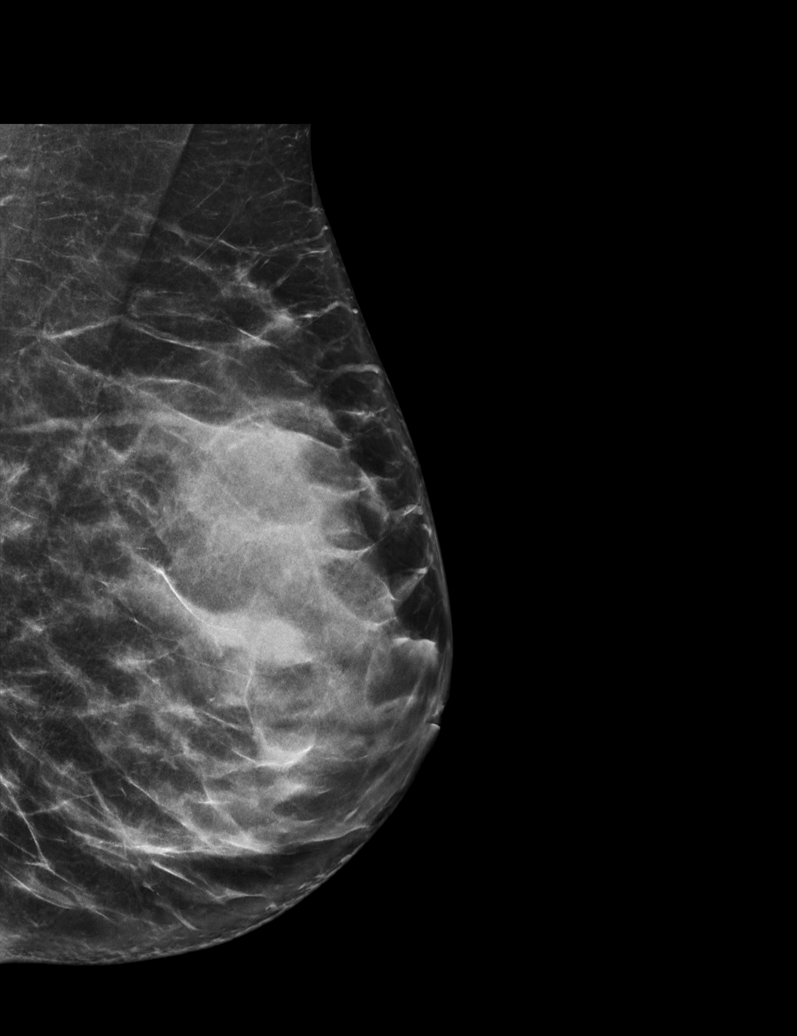

[R XCCL synth-2D]
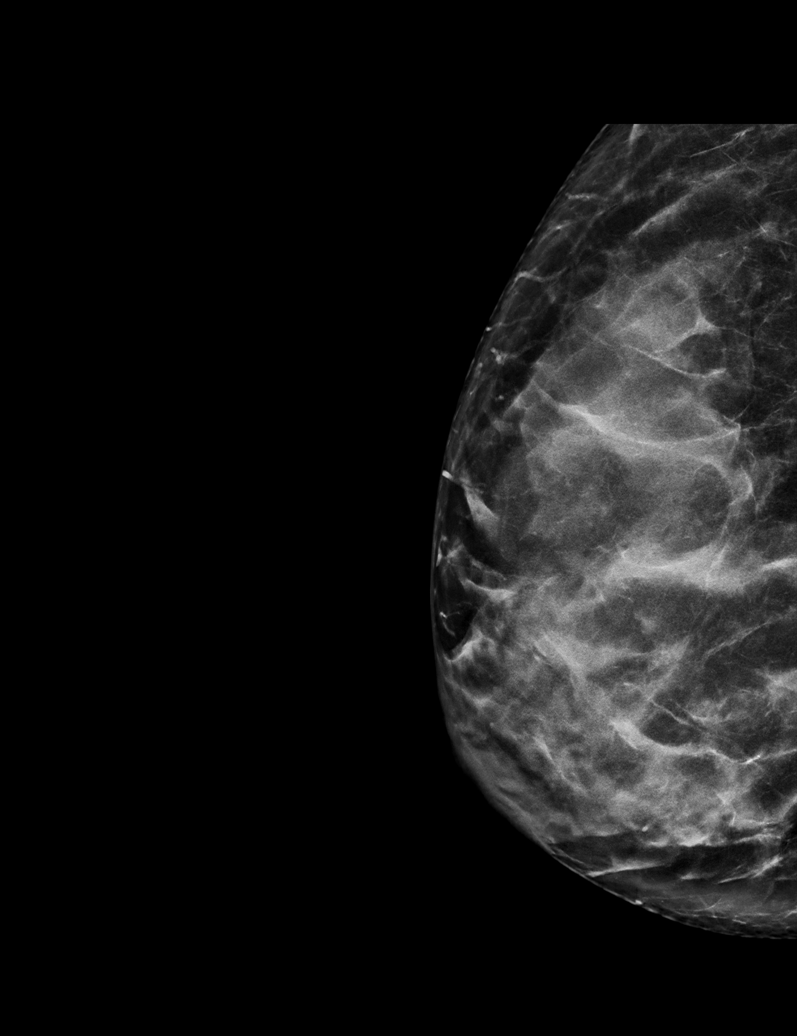

[R CC synth-2D]
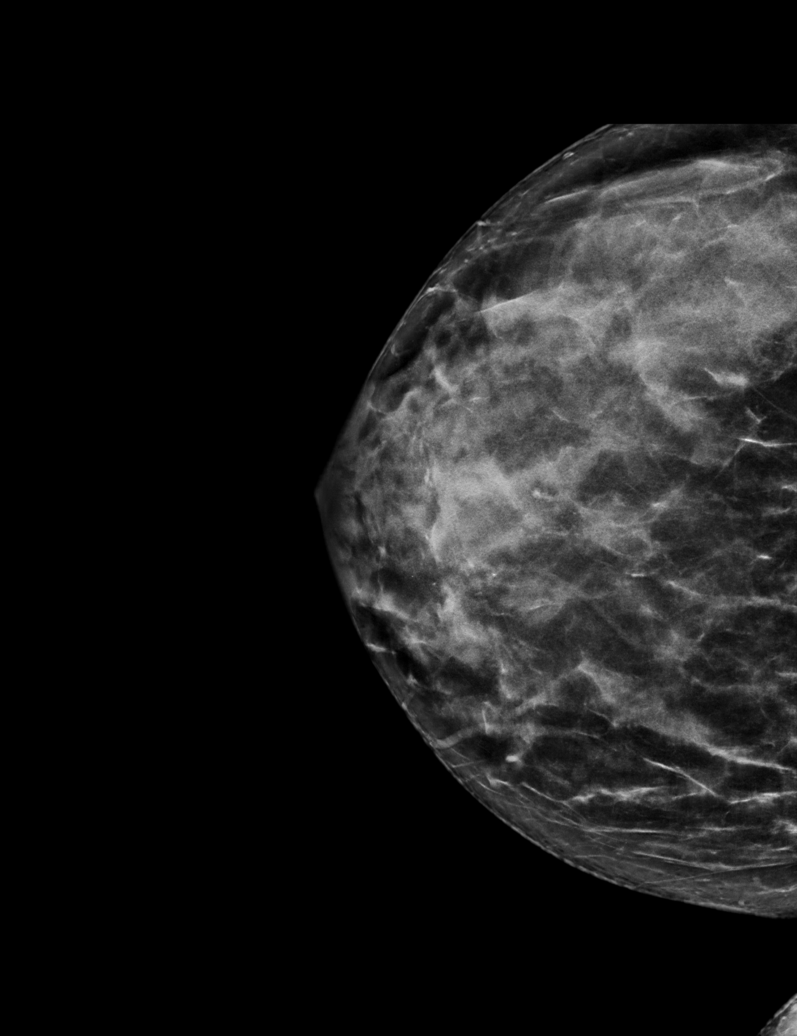

[L XCCL synth-2D]
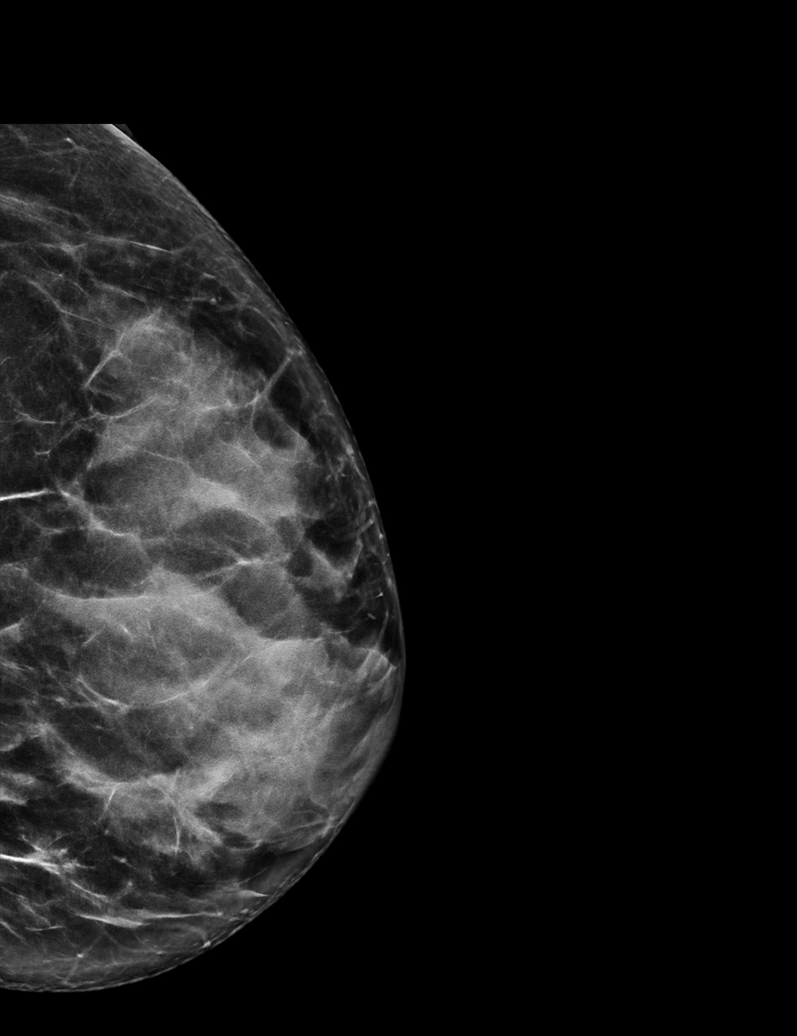

[L CC synth-2D]
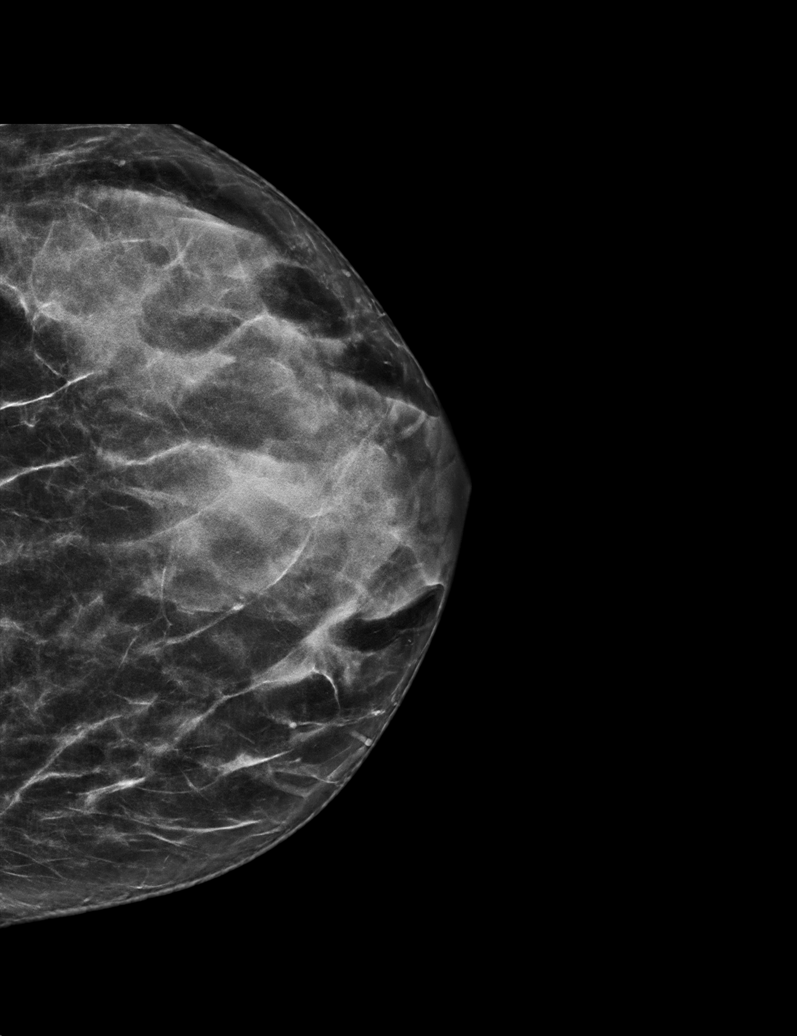

[6 of 36 positions shown; findings below may reference images not displayed]

ACR Breast Density Category d: The breast tissue is extremely dense,
which lowers the sensitivity of mammography.
FINDINGS: In the right axilla, a possible mass warrants further evaluation. In
the left breast, no findings suspicious for malignancy.
IMPRESSION: Further evaluation is suggested for possible mass in the right
axilla.

RECOMMENDATION:
Ultrasound of the right axilla. (Code:[id number]-99O)

The patient will be contacted regarding the findings, and additional
imaging will be scheduled.

BI-RADS CATEGORY  0: Incomplete. Need additional imaging evaluation
and/or prior mammograms for comparison.

## 2023-11-20 ENCOUNTER — Other Ambulatory Visit (HOSPITAL_BASED_OUTPATIENT_CLINIC_OR_DEPARTMENT_OTHER): Payer: Self-pay

## 2023-12-20 IMAGING — US US AXILLARY RIGHT
2 series · 7 of 7 positions shown · non-contrast
Comparison: 02/04/2020 mammogram

CLINICAL DATA: 40-year-old female for further evaluation of
possible enlarged RIGHT axillary lymph node on baseline screening
mammogram.

EXAM:
ULTRASOUND OF THE RIGHT AXILLA

[Series 1: us axillary right · 0.06mm/px · 5 of 5 slices shown (1 of 2)]
[im 1/5]
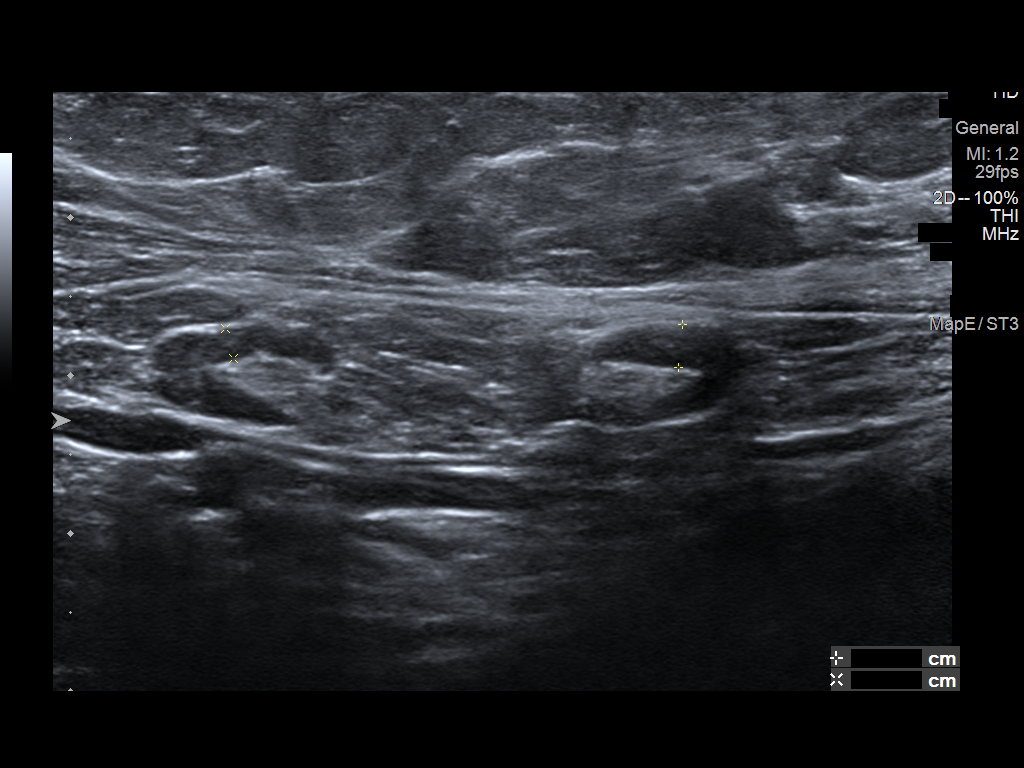
[im 2/5]
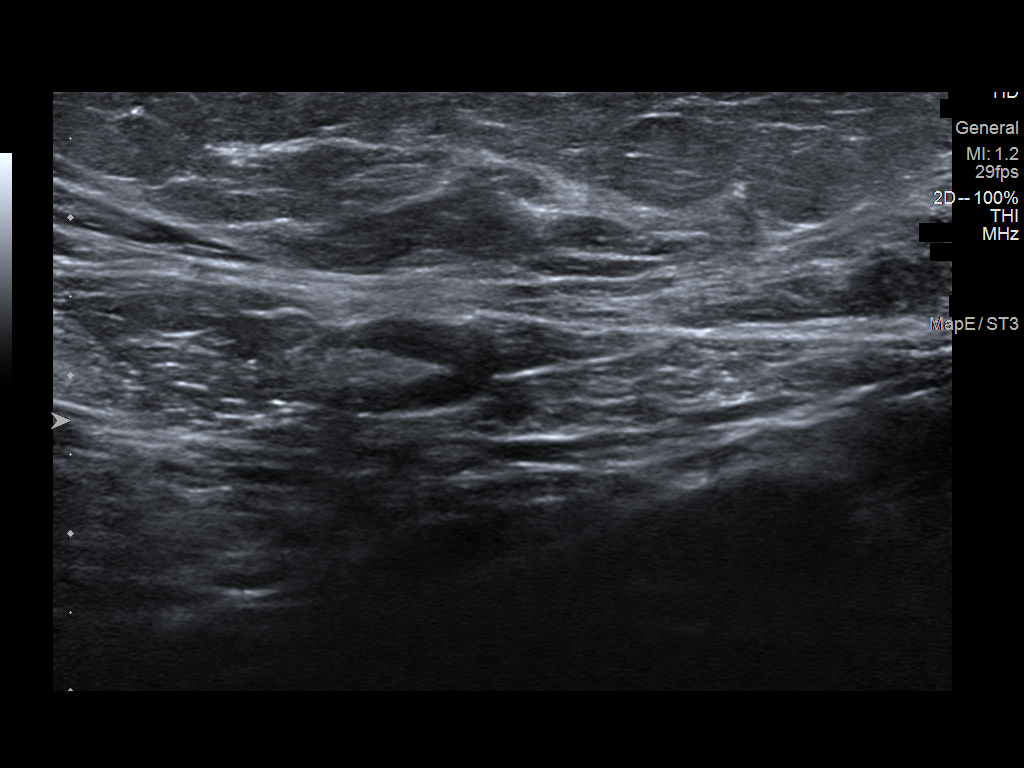
[im 3/5]
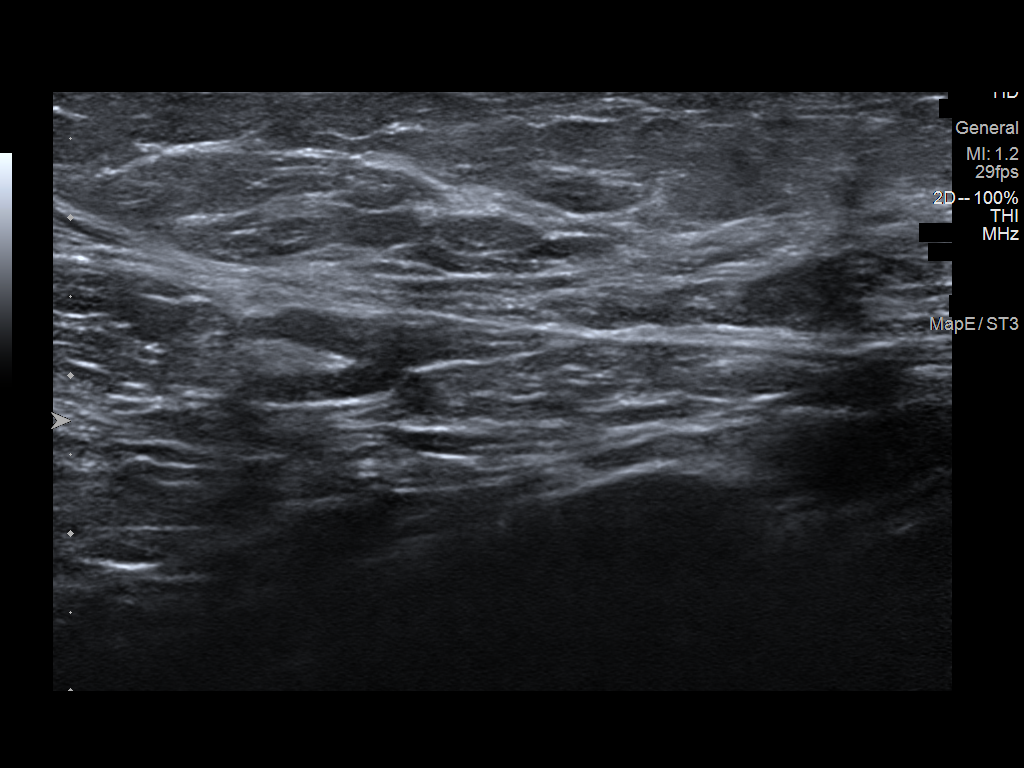
[im 4/5]
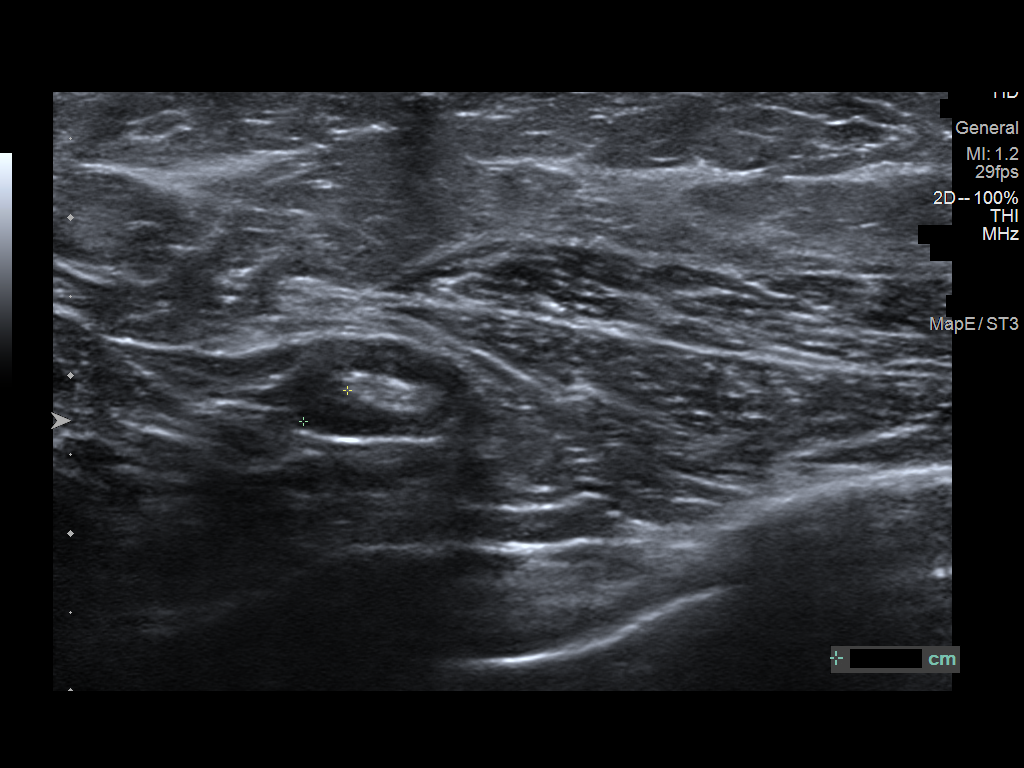
[im 5/5]
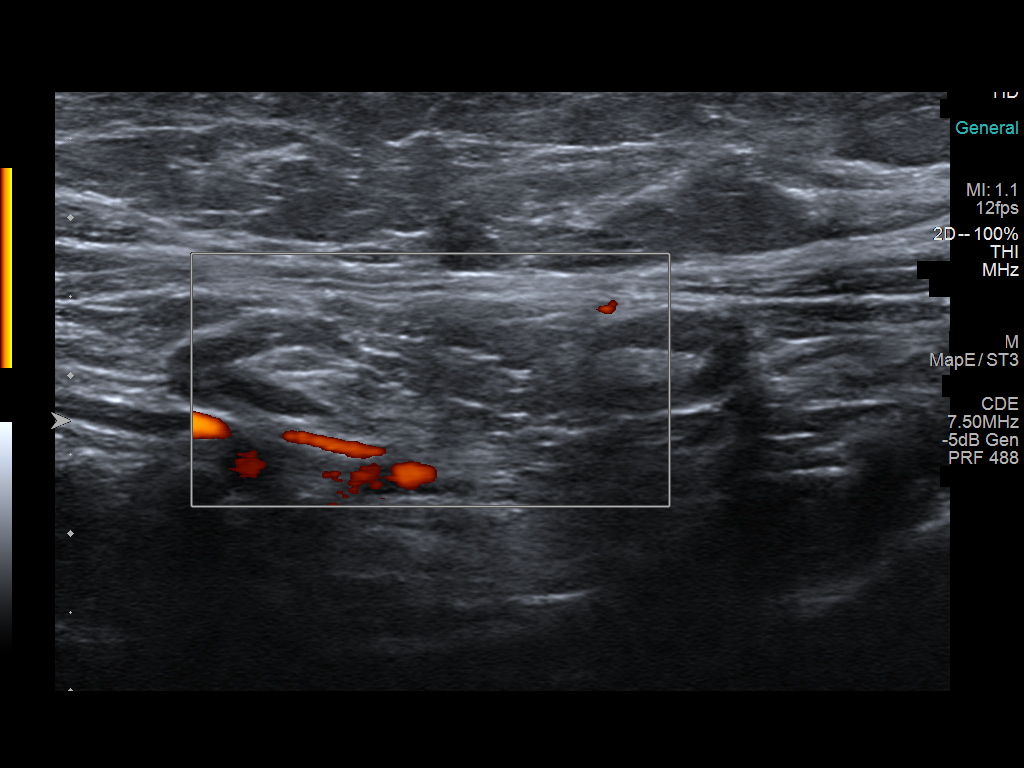

[Series 2: us axillary right · 0.06mm/px · 2 of 2 slices shown (2 of 2)]
[im 1/2]
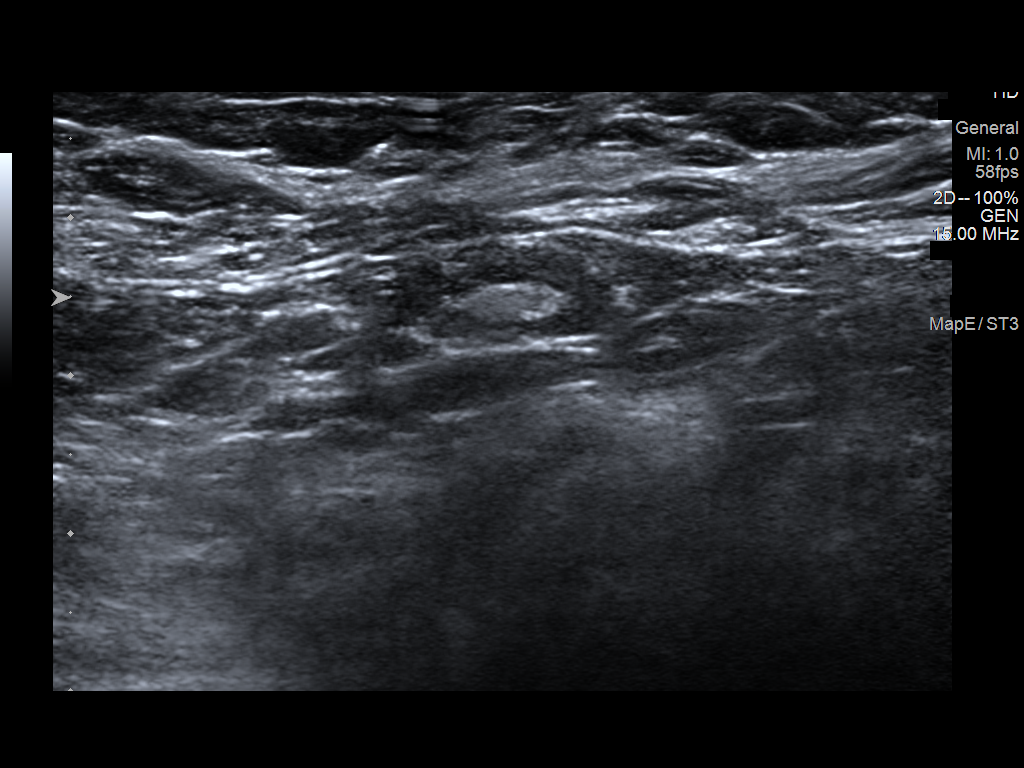
[im 2/2]
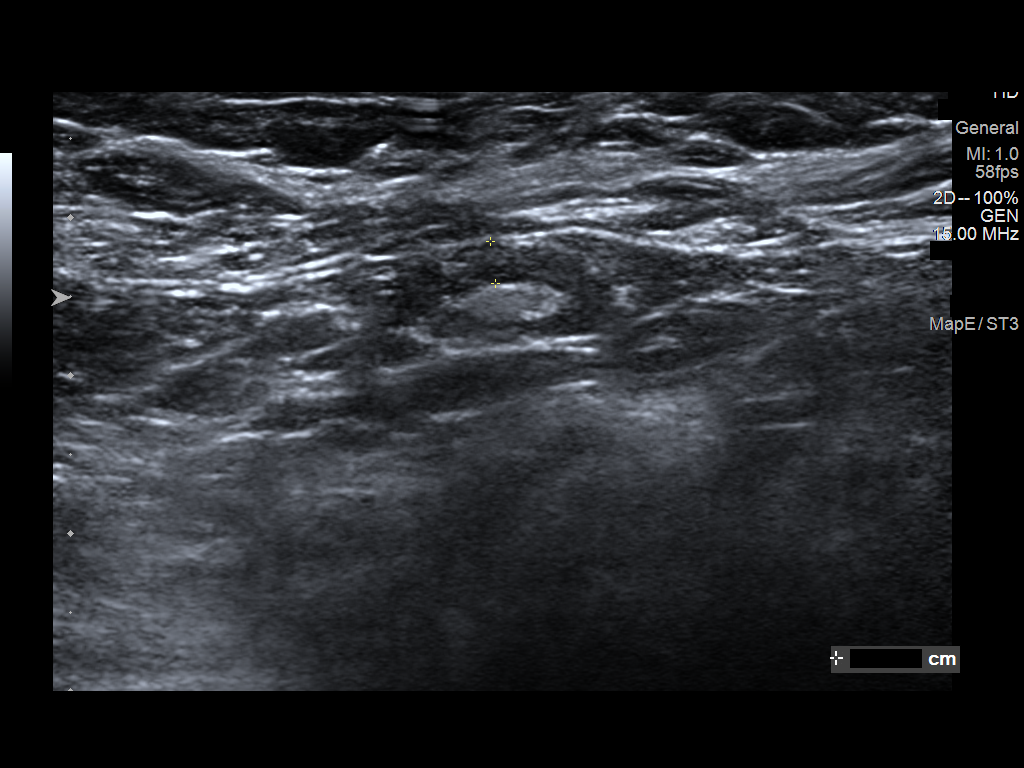

[7 of 7 positions shown; findings below may reference images not displayed]

FINDINGS: Ultrasound is performed, showing normal appearing RIGHT axillary
lymph nodes, symmetric to the LEFT side. No abnormal RIGHT axillary
lymph nodes are noted.
IMPRESSION: Normal appearing RIGHT axillary lymph nodes.

RECOMMENDATION:
Bilateral screening mammogram in 1 year.

I have discussed the findings and recommendations with the patient.
If applicable, a reminder letter will be sent to the patient
regarding the next appointment.

BI-RADS CATEGORY  2: Benign.

## 2023-12-21 ENCOUNTER — Other Ambulatory Visit (HOSPITAL_BASED_OUTPATIENT_CLINIC_OR_DEPARTMENT_OTHER): Payer: Self-pay

## 2023-12-21 MED ORDER — LISDEXAMFETAMINE DIMESYLATE 30 MG PO CHEW
30.0000 mg | CHEWABLE_TABLET | Freq: Every day | ORAL | 0 refills | Status: DC
  Filled 2023-12-21: qty 30, 30d supply, fill #0

## 2023-12-24 ENCOUNTER — Encounter: Payer: Self-pay | Admitting: Physician Assistant

## 2023-12-24 ENCOUNTER — Ambulatory Visit: Payer: Self-pay

## 2023-12-24 ENCOUNTER — Other Ambulatory Visit (HOSPITAL_BASED_OUTPATIENT_CLINIC_OR_DEPARTMENT_OTHER): Payer: Self-pay

## 2023-12-24 ENCOUNTER — Ambulatory Visit (INDEPENDENT_AMBULATORY_CARE_PROVIDER_SITE_OTHER): Admitting: Physician Assistant

## 2023-12-24 VITALS — BP 120/84 | HR 102 | Temp 98.2°F | Ht 64.0 in | Wt 177.0 lb

## 2023-12-24 DIAGNOSIS — J020 Streptococcal pharyngitis: Secondary | ICD-10-CM | POA: Diagnosis not present

## 2023-12-24 LAB — POCT RAPID STREP A (OFFICE): Rapid Strep A Screen: POSITIVE — AB

## 2023-12-24 MED ORDER — FLUCONAZOLE 150 MG PO TABS
ORAL_TABLET | ORAL | 0 refills | Status: AC
  Filled 2023-12-24: qty 2, 6d supply, fill #0

## 2023-12-24 MED ORDER — AMOXICILLIN 500 MG PO CAPS
500.0000 mg | ORAL_CAPSULE | Freq: Two times a day (BID) | ORAL | 0 refills | Status: AC
  Filled 2023-12-24: qty 20, 10d supply, fill #0

## 2023-12-24 NOTE — Telephone Encounter (Signed)
 FYI Only or Action Required?: FYI only for provider: appointment scheduled on 12/24/2023.  Patient was last seen in primary care on 08/20/2023 by Allwardt, Mardy HERO, PA-C.  Called Nurse Triage reporting Sore Throat.  Symptoms began yesterday.  Interventions attempted: Nothing.  Symptoms are: gradually worsening.  Triage Disposition: See Physician Within 24 Hours  Patient/caregiver understands and will follow disposition?: Yes    Copied from CRM #[id number]. Topic: Clinical - Red Word Triage >> Dec 24, 2023  8:02 AM Charlet HERO wrote: Red Word that prompted transfer to Nurse Triage: patient is stating that she has drainage and her throat looks raw and hurting also looks llike might have us  , Alyssa Allwardt Reason for Disposition  SEVERE throat pain (e.g., excruciating)  Answer Assessment - Initial Assessment Questions 1. ONSET: When did the throat start hurting? (Hours or days ago)      yesterday 2. SEVERITY: How bad is the sore throat? (Scale 1-10; mild, moderate or severe)     7/10 3. STREP EXPOSURE: Has there been any exposure to strep within the past week? If Yes, ask: What type of contact occurred?      unsure 4.  VIRAL SYMPTOMS: Are there any symptoms of a cold, such as a runny nose, cough, hoarse voice or red eyes?      runny nose, cough, hoarse voice 5. FEVER: Do you have a fever? If Yes, ask: What is your temperature, how was it measured, and when did it start?     no 6. PUS ON THE TONSILS: Is there pus on the tonsils in the back of your throat?     Possible pus 7. OTHER SYMPTOMS: Do you have any other symptoms? (e.g., difficulty breathing, headache, rash)     Drainage in back of throat, red, swollen, white patches, possible blister,  8. PREGNANCY: Is there any chance you are pregnant? When was your last menstrual period?     na  Protocols used: Sore Throat-A-AH

## 2023-12-24 NOTE — Progress Notes (Signed)
 Shannon Gordon is a 43 y.o. female here for a new problem.  History of Present Illness:   Chief Complaint  Patient presents with   Sore Throat    Pt c/o sore throat started yesterday, but worse today. Has been having chills no fever.   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   Arthralgia 05/02/2017   Patient has been followed by orthopedics in the past.  She does take Mobic and as needed tramadol .  I am okay with taking his medications over.   Brown recluse spider bite    GERD (gastroesophageal reflux disease) Sept 2013   History of COVID-19 12/2019   Non-seasonal allergic rhinitis 02/08/2019   Pain in finger of right hand 07/26/2022   Pain in left foot 11/18/2019   Pain in left knee 01/28/2019   PONV (postoperative nausea and vomiting)    Positive ANA (antinuclear antibody) 05/02/2017   Patient already follows with rheumatology.   Throat pain in adult 02/08/2019   Urticaria      Social History   Tobacco Use   Smoking status: Former    Current packs/day: 0.00    Average packs/day: 0.5 packs/day for 10.0 years (5.0 ttl pk-yrs)    Types: Cigarettes    Start date: 2003    Quit date: 2013    Years since quitting: 12.9   Smokeless tobacco: Never  Vaping Use   Vaping status: Never Used  Substance Use Topics   Alcohol use: Yes    Comment: occ use only    Drug use: Never    Past Surgical History:  Procedure Laterality Date   COLONOSCOPY  03/29/2021   ESOPHAGEAL  DILATION  04/07/2020   Procedure: ESOPHAGEAL DILATION;  Surgeon: San Sandor GAILS, DO;  Location: [facility];  Service: Gastroenterology;;   ESOPHAGOGASTRODUODENOSCOPY N/A 04/07/2020   Procedure: ESOPHAGOGASTRODUODENOSCOPY (EGD);  Surgeon: San Sandor GAILS, DO;  Location: [facility];  Service: Gastroenterology;  Laterality: N/A;   LEG SURGERY Right    right lower leg tissue removal from brown recluse spider bite   TRANSORAL INCISIONLESS FUNDOPLICATION N/A 04/07/2020   Procedure: TRANSORAL INCISIONLESS FUNDOPLICATION;  Surgeon: San Sandor GAILS, DO;  Location: [facility];  Service: Gastroenterology;  Laterality: N/A;    Family History  Problem Relation Age of Onset   Thyroid  disease Mother    Hypercalcemia Mother    Colon polyps Mother    Colon polyps Brother 20   Hypothyroidism Maternal Grandmother    Colon cancer Maternal Grandmother    Cancer Maternal Grandmother    Stroke Maternal Grandmother    Colon cancer Other        mat. great GF, mat great Aunt , mat GF's sister   Stroke Maternal Grandfather    Heart disease Paternal Grandmother    Esophageal cancer Neg Hx    Rectal cancer Neg Hx    Stomach cancer Neg Hx     Allergies  Allergen Reactions   Formaldehyde  Latex Hives    Current Medications:   Current Outpatient Medications:    amoxicillin  (AMOXIL ) 500 MG capsule, Take 1 capsule (500 mg total) by mouth 2 (two) times daily for 10 days., Disp: 20 capsule, Rfl: 0   baclofen  (LIORESAL ) 20 MG tablet, TAKE 1 TABLET(20 MG) BY MOUTH DAILY AS NEEDED FOR MUSCLE SPASMS, Disp: 30 tablet, Rfl: 2   cetirizine  (ZYRTEC ) 10 MG tablet, Take 1 tablet (10 mg total) by mouth 2 (two) times daily., Disp: 30 tablet, Rfl: 5   famotidine  (PEPCID ) 20 MG tablet, Take 1 tablet (20 mg total) by mouth 2 (two) times daily., Disp: 60 tablet, Rfl: 1   fluconazole  (DIFLUCAN ) 150 MG tablet, Take 1 tablet by mouth. If symptoms persist, may take an additional tablet in 3-5 days. May repeat  for a total of 3 tablets., Disp: 3 tablet, Rfl: 0   levocetirizine (XYZAL ) 5 MG tablet, Take 1 tablet (5 mg total) by mouth every evening., Disp: 30 tablet, Rfl: 2   levonorgestrel (MIRENA) 20 MCG/24HR IUD, 1 each by Intrauterine route once., Disp: , Rfl:    lisdexamfetamine (VYVANSE ) 10 MG capsule, Take 1 capsule by mouth twice a day, Disp: 60 capsule, Rfl: 0   lisdexamfetamine (VYVANSE ) 20 MG capsule, Take 1 capsule by mouth daily, Disp: 30 capsule, Rfl: 0   lisdexamfetamine (VYVANSE ) 30 MG chewable tablet, Chew 1 tablet (30 mg total) by mouth daily., Disp: 30 tablet, Rfl: 0   Lisdexamfetamine Dimesylate  (VYVANSE ) 10 MG CHEW, Take 1 tablet by mouth twice a day, Disp: 60 tablet, Rfl: 0   meclizine  (ANTIVERT ) 25 MG tablet, Take 1 tablet (25 mg total) by mouth 3 (three) times daily as needed for dizziness., Disp: 30 tablet, Rfl: 0   methylphenidate  (METADATE  CD) 10 MG CR capsule, Take 1 capsule by mouth twice a day, Disp: 14 capsule, Rfl: 0   methylPREDNISolone  (MEDROL ) 4 MG TBPK tablet, Take as directed, Disp: 21 tablet, Rfl: 0   ondansetron  (ZOFRAN ) 4 MG tablet, Take 1 tablet (4 mg total) by mouth every 8 (eight) hours as needed for nausea or vomiting., Disp: 20 tablet, Rfl: 0   valACYclovir (VALTREX) 500 MG tablet, 1 tablet as needed., Disp: , Rfl:    Review of Systems:   Negative unless otherwise specified per HPI.  Vitals:   Vitals:   12/24/23 1538  BP: 120/84  Pulse: (!) 102  Temp: 98.2 F (36.8 C)  TempSrc: Temporal  SpO2: 98%  Weight: 177 lb (80.3 kg)  Height: 5' 4 (1.626 m)     Body mass index is 30.38 kg/m.  Physical Exam:   Physical Exam Vitals and nursing note reviewed.  Constitutional:      General: She is not in acute distress.    Appearance: She is well-developed. She is not ill-appearing or toxic-appearing.  HENT:     Head: Normocephalic and atraumatic.     Right Ear: Tympanic membrane, ear canal and external ear normal. Tympanic membrane is not erythematous,  retracted or bulging.     Left Ear: Tympanic membrane, ear canal and external ear normal. Tympanic membrane is not erythematous, retracted or bulging.     Nose: Nose normal.     Right Sinus: No maxillary sinus tenderness or frontal sinus tenderness.     Left Sinus: No maxillary sinus tenderness or frontal sinus tenderness.     Mouth/Throat:     Pharynx: Uvula midline. Posterior oropharyngeal erythema present.  Eyes:     General: Lids are normal.  Conjunctiva/sclera: Conjunctivae normal.  Neck:     Trachea: Trachea normal.  Cardiovascular:     Rate and Rhythm: Normal rate and regular rhythm.     Pulses: Normal pulses.     Heart sounds: Normal heart sounds, S1 normal and S2 normal.  Pulmonary:     Effort: Pulmonary effort is normal.     Breath sounds: Normal breath sounds. No decreased breath sounds, wheezing, rhonchi or rales.  Lymphadenopathy:     Cervical: Cervical adenopathy present.  Skin:    General: Skin is warm and dry.  Neurological:     Mental Status: She is alert.     GCS: GCS eye subscore is 4. GCS verbal subscore is 5. GCS motor subscore is 6.  Psychiatric:        Speech: Speech normal.        Behavior: Behavior normal. Behavior is cooperative.    Results for orders placed or performed in visit on 12/24/23  POCT rapid strep A  Result Value Ref Range   Rapid Strep A Screen Positive (A) Negative     Assessment and Plan:   Sore throat No red flags on exam.   Will initiate amoxicillin  per orders.  Discussed taking medications as prescribed.  Reviewed return precautions including new or worsening fever, SOB, new or worsening cough or other concerns.  Push fluids and rest.  I recommend that patient follow-up if symptoms worsen or persist despite treatment x 7-10 days, sooner if needed.  Provided her pocket prescription for diflucan  as she often gets yeast infections from antibiotic(s) use   Andreu Drudge, PA-C

## 2023-12-24 NOTE — Telephone Encounter (Signed)
Noted pt. scheduled

## 2024-01-22 ENCOUNTER — Other Ambulatory Visit (HOSPITAL_BASED_OUTPATIENT_CLINIC_OR_DEPARTMENT_OTHER): Payer: Self-pay

## 2024-01-22 MED ORDER — LISDEXAMFETAMINE DIMESYLATE 30 MG PO CHEW
30.0000 mg | CHEWABLE_TABLET | Freq: Every day | ORAL | 0 refills | Status: DC
  Filled 2024-01-22: qty 30, 30d supply, fill #0

## 2024-02-05 ENCOUNTER — Other Ambulatory Visit (HOSPITAL_BASED_OUTPATIENT_CLINIC_OR_DEPARTMENT_OTHER): Payer: Self-pay

## 2024-02-05 MED ORDER — HYDROCODONE-ACETAMINOPHEN 5-325 MG PO TABS
1.0000 | ORAL_TABLET | ORAL | 0 refills | Status: AC
  Filled 2024-02-05: qty 2, 1d supply, fill #0

## 2024-02-05 MED ORDER — MISOPROSTOL 100 MCG PO TABS
100.0000 ug | ORAL_TABLET | ORAL | 0 refills | Status: AC
  Filled 2024-02-05: qty 2, 1d supply, fill #0

## 2024-02-05 MED ORDER — IBUPROFEN 800 MG PO TABS
800.0000 mg | ORAL_TABLET | Freq: Three times a day (TID) | ORAL | 0 refills | Status: AC | PRN
  Filled 2024-02-05: qty 30, 10d supply, fill #0

## 2024-02-22 ENCOUNTER — Other Ambulatory Visit (HOSPITAL_BASED_OUTPATIENT_CLINIC_OR_DEPARTMENT_OTHER): Payer: Self-pay

## 2024-02-22 MED ORDER — LISDEXAMFETAMINE DIMESYLATE 30 MG PO CHEW
30.0000 mg | CHEWABLE_TABLET | Freq: Every day | ORAL | 0 refills | Status: AC
  Filled 2024-02-22: qty 30, 30d supply, fill #0

## 2024-02-26 ENCOUNTER — Other Ambulatory Visit (HOSPITAL_BASED_OUTPATIENT_CLINIC_OR_DEPARTMENT_OTHER): Payer: Self-pay

## 2024-02-28 ENCOUNTER — Other Ambulatory Visit (HOSPITAL_BASED_OUTPATIENT_CLINIC_OR_DEPARTMENT_OTHER): Payer: Self-pay

## 2024-02-29 ENCOUNTER — Other Ambulatory Visit: Payer: Self-pay

## 2024-02-29 ENCOUNTER — Other Ambulatory Visit (HOSPITAL_BASED_OUTPATIENT_CLINIC_OR_DEPARTMENT_OTHER): Payer: Self-pay

## 2024-08-21 ENCOUNTER — Encounter: Admitting: Physician Assistant
